# Patient Record
Sex: Male | Born: 1940
Health system: Southern US, Community
[De-identification: ages and names within clinical notes are randomized; demographics above are authoritative.]

## PROBLEM LIST (undated history)

## (undated) DIAGNOSIS — Z87442 Personal history of urinary calculi: Secondary | ICD-10-CM

## (undated) DIAGNOSIS — Z9849 Cataract extraction status, unspecified eye: Secondary | ICD-10-CM

## (undated) DIAGNOSIS — N200 Calculus of kidney: Secondary | ICD-10-CM

## (undated) DIAGNOSIS — K219 Gastro-esophageal reflux disease without esophagitis: Secondary | ICD-10-CM

## (undated) DIAGNOSIS — M1990 Unspecified osteoarthritis, unspecified site: Secondary | ICD-10-CM

## (undated) HISTORY — PX: APPENDECTOMY: SHX54

## (undated) HISTORY — PX: COLONOSCOPY: SHX174

## (undated) HISTORY — PX: OTHER SURGICAL HISTORY: SHX169

---

## 2000-09-10 ENCOUNTER — Emergency Department (HOSPITAL_COMMUNITY): Admission: EM | Admit: 2000-09-10 | Discharge: 2000-09-10 | Payer: Self-pay | Admitting: Emergency Medicine

## 2000-09-10 ENCOUNTER — Encounter: Payer: Self-pay | Admitting: *Deleted

## 2005-12-03 ENCOUNTER — Ambulatory Visit: Payer: Self-pay | Admitting: Internal Medicine

## 2005-12-03 ENCOUNTER — Ambulatory Visit (HOSPITAL_COMMUNITY): Admission: RE | Admit: 2005-12-03 | Discharge: 2005-12-03 | Payer: Self-pay | Admitting: Internal Medicine

## 2007-03-23 DIAGNOSIS — Z9849 Cataract extraction status, unspecified eye: Secondary | ICD-10-CM

## 2007-03-23 HISTORY — DX: Cataract extraction status, unspecified eye: Z98.49

## 2009-11-08 ENCOUNTER — Inpatient Hospital Stay (HOSPITAL_COMMUNITY): Admission: EM | Admit: 2009-11-08 | Discharge: 2009-11-10 | Payer: Self-pay | Admitting: Emergency Medicine

## 2010-06-05 LAB — COMPREHENSIVE METABOLIC PANEL
ALT: 13 U/L (ref 0–53)
AST: 17 U/L (ref 0–37)
Albumin: 3.6 g/dL (ref 3.5–5.2)
Alkaline Phosphatase: 46 U/L (ref 39–117)
Alkaline Phosphatase: 64 U/L (ref 39–117)
BUN: 10 mg/dL (ref 6–23)
BUN: 16 mg/dL (ref 6–23)
CO2: 26 mEq/L (ref 19–32)
Calcium: 8.9 mg/dL (ref 8.4–10.5)
Chloride: 106 mEq/L (ref 96–112)
Chloride: 109 mEq/L (ref 96–112)
Creatinine, Ser: 1 mg/dL (ref 0.4–1.5)
GFR calc Af Amer: >60 mL/min (ref >60)
GFR calc non Af Amer: >60 mL/min (ref >60)
Glucose, Bld: 114 mg/dL — ABNORMAL HIGH (ref 70–99)
Glucose, Bld: 83 mg/dL (ref 70–99)
Potassium: 4.2 mEq/L (ref 3.5–5.1)
Potassium: 4.3 mEq/L (ref 3.5–5.1)
Sodium: 137 mEq/L (ref 135–145)
Total Bilirubin: 0.6 mg/dL (ref 0.3–1.2)
Total Bilirubin: 0.9 mg/dL (ref 0.3–1.2)
Total Protein: 6.9 g/dL (ref 6.0–8.3)

## 2010-06-05 LAB — CBC
HCT: 38.7 % — ABNORMAL LOW (ref 39.0–52.0)
HCT: 38.8 % — ABNORMAL LOW (ref 39.0–52.0)
HCT: 45.2 % (ref 39.0–52.0)
Hemoglobin: 15.2 g/dL (ref 13.0–17.0)
MCH: 31.7 pg (ref 26.0–34.0)
MCHC: 33.1 g/dL (ref 30.0–36.0)
MCHC: 33.6 g/dL (ref 30.0–36.0)
MCV: 94.4 fL (ref 78.0–100.0)
MCV: 95.6 fL (ref 78.0–100.0)
Platelets: 149 10*3/uL — ABNORMAL LOW (ref 150–400)
RBC: 4.06 MIL/uL — ABNORMAL LOW (ref 4.22–5.81)
RBC: 4.79 MIL/uL (ref 4.22–5.81)
RDW: 12.8 % (ref 11.5–15.5)
RDW: 13.1 % (ref 11.5–15.5)
WBC: 14.4 10*3/uL — ABNORMAL HIGH (ref 4.0–10.5)
WBC: 5.8 10*3/uL (ref 4.0–10.5)

## 2010-06-05 LAB — URINALYSIS, ROUTINE W REFLEX MICROSCOPIC
Bilirubin Urine: NEGATIVE
Glucose, UA: NEGATIVE mg/dL
Hgb urine dipstick: NEGATIVE
Ketones, ur: NEGATIVE mg/dL
Nitrite: NEGATIVE
Protein, ur: NEGATIVE mg/dL
Specific Gravity, Urine: 1.019 (ref 1.005–1.030)
Urobilinogen, UA: 0.2 mg/dL (ref 0.0–1.0)
pH: 5 (ref 5.0–8.0)

## 2010-06-05 LAB — BASIC METABOLIC PANEL
BUN: 7 mg/dL (ref 6–23)
Calcium: 8.1 mg/dL — ABNORMAL LOW (ref 8.4–10.5)
GFR calc non Af Amer: >60 mL/min (ref >60)
Glucose, Bld: 96 mg/dL (ref 70–99)

## 2010-06-05 LAB — DIFFERENTIAL
Basophils Absolute: 0 10*3/uL (ref 0.0–0.1)
Basophils Relative: 0 % (ref 0–1)
Eosinophils Absolute: 0.1 K/uL (ref 0.0–0.7)
Eosinophils Relative: 1 % (ref 0–5)
Lymphocytes Relative: 2 % — ABNORMAL LOW (ref 12–46)
Lymphs Abs: 0.3 K/uL — ABNORMAL LOW (ref 0.7–4.0)
Monocytes Absolute: 1.1 10*3/uL — ABNORMAL HIGH (ref 0.1–1.0)
Monocytes Relative: 8 % (ref 3–12)
Neutro Abs: 12.8 10*3/uL — ABNORMAL HIGH (ref 1.7–7.7)
Neutrophils Relative %: 89 % — ABNORMAL HIGH (ref 43–77)

## 2010-06-05 LAB — LACTATE DEHYDROGENASE: LDH: 118 U/L (ref 94–250)

## 2010-06-05 LAB — MAGNESIUM: Magnesium: 1.8 mg/dL (ref 1.5–2.5)

## 2010-06-05 LAB — LIPASE, BLOOD: Lipase: 29 U/L (ref 11–59)

## 2010-06-05 LAB — HAPTOGLOBIN: Haptoglobin: 219 mg/dL — ABNORMAL HIGH (ref 16–200)

## 2010-06-05 LAB — POCT CARDIAC MARKERS: Myoglobin, poc: 101 ng/mL (ref 12–200)

## 2010-06-10 ENCOUNTER — Emergency Department (HOSPITAL_COMMUNITY)
Admission: EM | Admit: 2010-06-10 | Discharge: 2010-06-10 | Disposition: A | Discharge status: Home / Self Care | Payer: Medicare Other | Attending: Emergency Medicine | Admitting: Emergency Medicine

## 2010-06-10 ENCOUNTER — Emergency Department (HOSPITAL_COMMUNITY): Payer: Medicare Other

## 2010-06-10 DIAGNOSIS — R109 Unspecified abdominal pain: Secondary | ICD-10-CM | POA: Insufficient documentation

## 2010-06-10 DIAGNOSIS — M79609 Pain in unspecified limb: Secondary | ICD-10-CM | POA: Insufficient documentation

## 2010-06-10 LAB — COMPREHENSIVE METABOLIC PANEL
AST: 19 U/L (ref 0–37)
Albumin: 3.5 g/dL (ref 3.5–5.2)
Calcium: 8.7 mg/dL (ref 8.4–10.5)
Chloride: 104 mEq/L (ref 96–112)
Creatinine, Ser: 1.1 mg/dL (ref 0.4–1.5)
GFR calc Af Amer: >60 mL/min (ref >60)
Total Bilirubin: 0.7 mg/dL (ref 0.3–1.2)
Total Protein: 6.6 g/dL (ref 6.0–8.3)

## 2010-06-10 LAB — CBC
MCH: 32.2 pg (ref 26.0–34.0)
Platelets: 139 10*3/uL — ABNORMAL LOW (ref 150–400)
RBC: 4.35 MIL/uL (ref 4.22–5.81)
RDW: 13.1 % (ref 11.5–15.5)

## 2010-06-10 LAB — URINALYSIS, ROUTINE W REFLEX MICROSCOPIC
Glucose, UA: NEGATIVE mg/dL
Hgb urine dipstick: NEGATIVE
Specific Gravity, Urine: 1.025 (ref 1.005–1.030)
pH: 6 (ref 5.0–8.0)

## 2010-06-10 LAB — DIFFERENTIAL
Basophils Relative: 1 % (ref 0–1)
Eosinophils Absolute: 0.1 10*3/uL (ref 0.0–0.7)
Eosinophils Relative: 1 % (ref 0–5)
Monocytes Relative: 12 % (ref 3–12)
Neutrophils Relative %: 66 % (ref 43–77)

## 2010-08-07 NOTE — Op Note (Signed)
NAME:  Corey Zamora, Corey Zamora               ACCOUNT NO.:  [id number]   MEDICAL RECORD NO.:  [id number]          PATIENT TYPE:  AMB   LOCATION:  DAY                           FACILITY:  [facility]   PHYSICIAN:  Lionel December, M.D.    DATE OF BIRTH:  [birth year]   DATE OF PROCEDURE:  12/03/2005  DATE OF DISCHARGE:                                 OPERATIVE REPORT   PROCEDURE:  Esophagogastroduodenoscopy with esophageal dilation followed by  colonoscopy.   INDICATIONS FOR PROCEDURE:  Corey Zamora is a 70 year old Caucasian male who  is here for surveillance colonoscopy but he also complains of solid food  dysphagia.  He had his esophagus last dilated in March 2002 and responded  for awhile.  He denies frequent heartburn, he is very concerned about the  symptoms because he chews tobacco and he is aware of the potential risks.  He had tubular adenoma removed from his colon in 1997.  His last exam in  March 2002 was normal.  Family history is positive for colon carcinoma in a  brother.  The procedure risks were reviewed with the patient and informed  consent was obtained.   MEDICATIONS FOR CONSCIOUS SEDATION:  Benzocaine spray for pharyngeal topical  anesthesia, Fentanyl 25 mcg IV, Versed 2 mg IV.   FINDINGS:  The procedure is performed in the endoscopy suite.  The patient's  vital signs and O2 saturations were monitored during the procedure and  remained stable.   ESOPHAGOGASTRODUODENOSCOPY:  The patient was placed in the left lateral  position and the Olympus videoscope was passed via oropharynx without any  difficulty into the esophagus.   The esophageal mucosa of the esophagus was normal.  The GE junction was at  41 cm from the incisors.  There was no ring or stricture noted.   The stomach was empty and distended very well with insufflation.  The folds  of the proximal stomach were normal.  Examination of the mucosa at body,  antrum, pyloric channel, as well as angularis, fundus, and cardia was  normal.   Duodenum and bulbar mucosa was normal.  The scope was passed to the second  part of the duodenum where the mucosa and folds were normal.  The endoscope  was withdrawn.   The esophagus was dilated by passing 56 [place] Maloney dilator to full  insertion.  As the dilator was withdrawn, the endoscope was passed again and  no mucosal disruption noted in the esophagus.  The endoscope was withdrawn  and the patient prepared for procedure number 2.   COLONOSCOPY:  Rectal examination performed.  No abnormality noted on  external or digital exam.  The Olympus videoscope was placed in the rectum  and advanced under vision in the sigmoid colon and beyond.  The preparation  was satisfactory.  There were a few tiny diverticula noted at the sigmoid  colon.  The patient was placed in the supine position and turned on the  right side in order to get the scope to the cecum which was identified by  the appendiceal orifice and ileocecal valve.  A short segment of  TI was also  examined and was normal.  Pictures were taken for the record.  As the scope  was withdrawn, colonic mucosa was once again carefully examined and was  normal throughout.  The rectal mucosa was normal.  The scope was retroflexed  to examine the anorectal junction which was unremarkable.  The endoscope was  straightened and withdrawn.  The patient tolerated the procedure well.   FINAL DIAGNOSIS:  1. Normal esophagogastroduodenoscopy.  Esophagus dilated by passing 56      Gholson Maloney dilator given history of solid food dysphagia.  2. A few small diverticula at sigmoid colon, otherwise, normal      examination.   RECOMMENDATIONS:  1. He will resume his usual meds.  2. High fiber diet.  3. He will give [place] a call if he remains with dysphagia in which case I      will obtain a barium pill study.  4. Yearly hemoccults.  5. He should have next colonoscopy five years from now.      Lionel December, M.D.  Electronically  Signed     NR/MEDQ  D:  12/03/2005  T:  12/03/2005  Job:  [id number]   cc:   Kingsley Callander. Ouida Sills, MD  Fax: [phone]

## 2010-11-24 ENCOUNTER — Encounter (INDEPENDENT_AMBULATORY_CARE_PROVIDER_SITE_OTHER): Payer: Self-pay | Admitting: *Deleted

## 2010-11-26 ENCOUNTER — Telehealth (INDEPENDENT_AMBULATORY_CARE_PROVIDER_SITE_OTHER): Payer: Self-pay | Admitting: *Deleted

## 2010-11-26 DIAGNOSIS — Z800 Family history of malignant neoplasm of digestive organs: Secondary | ICD-10-CM

## 2010-11-26 DIAGNOSIS — R1310 Dysphagia, unspecified: Secondary | ICD-10-CM

## 2010-11-26 NOTE — Telephone Encounter (Signed)
TCS/EGD sch'd 02/18/11 @ 10:30 (9:30), movi prep instructions given

## 2010-11-30 MED ORDER — PEG-KCL-NACL-NASULF-NA ASC-C 100 G PO SOLR: 1.0000 | Freq: Once | ORAL | Status: DC

## 2010-12-07 ENCOUNTER — Encounter (INDEPENDENT_AMBULATORY_CARE_PROVIDER_SITE_OTHER): Payer: Self-pay | Admitting: *Deleted

## 2011-01-25 ENCOUNTER — Other Ambulatory Visit (INDEPENDENT_AMBULATORY_CARE_PROVIDER_SITE_OTHER): Payer: Self-pay | Admitting: *Deleted

## 2011-01-25 DIAGNOSIS — Z800 Family history of malignant neoplasm of digestive organs: Secondary | ICD-10-CM

## 2011-02-05 ENCOUNTER — Encounter (HOSPITAL_COMMUNITY): Payer: Self-pay | Admitting: Pharmacy Technician

## 2011-02-09 ENCOUNTER — Telehealth (INDEPENDENT_AMBULATORY_CARE_PROVIDER_SITE_OTHER): Payer: Self-pay | Admitting: *Deleted

## 2011-02-09 NOTE — Telephone Encounter (Signed)
PCP/Requesting MD:  Ouida Sills  Name: Corey Zamora  DOB: Dec 18, 2040  Home Phone: [phone]      Procedure: TCS/EGD  Reason/Indication:  SURVEILLANCE, + FH CRC, DYSPHAGIA  Has patient had this procedure before?  YES (TCS)  If so, when, by whom and where?  2007  Is there a family history of colon cancer?  YES  Who?  What age when diagnosed?  BROTHER  Is patient diabetic?   NO      Does patient have prosthetic heart valve?  NO  Do you have a pacemaker?  NO  Has patient had joint replacement within last 12 months?  NO  Is patient on Coumadin, Plavix and/or Aspirin? NO  Medications: FLAX SEED OIL 1000 MG TID  Allergies: TYLENOL 3, DARVOCET, ANCEF, ERYTHROMYCIN, VISTARIL, DENUROL, DOXYCYCLINE, ASA, PCN, ETHROMICIN, CIPRO, HYCLATE, IVP DYE, PHENERGAN, BUPRINIX, PHENERGAN W/ MORPHINE  Pharmacy: [facility]  Medication Adjustment: NONE  Procedure date & time: 02/18/11 @ 10:30

## 2011-02-10 NOTE — Telephone Encounter (Signed)
Agree with colonoscopy and EGD/ED

## 2011-02-17 MED ORDER — SODIUM CHLORIDE 0.45 % IV SOLN
Freq: Once | INTRAVENOUS | Status: AC
  Administered 2011-02-18: 10:00:00 via INTRAVENOUS

## 2011-02-18 ENCOUNTER — Other Ambulatory Visit (INDEPENDENT_AMBULATORY_CARE_PROVIDER_SITE_OTHER): Payer: Self-pay | Admitting: Internal Medicine

## 2011-02-18 ENCOUNTER — Ambulatory Visit (HOSPITAL_COMMUNITY)
Admission: RE | Admit: 2011-02-18 | Discharge: 2011-02-18 | Disposition: A | Discharge status: Home / Self Care | Payer: Medicare Other | Source: Ambulatory Visit | Attending: Internal Medicine | Admitting: Internal Medicine

## 2011-02-18 ENCOUNTER — Encounter (HOSPITAL_COMMUNITY): Payer: Self-pay | Admitting: *Deleted

## 2011-02-18 ENCOUNTER — Encounter (HOSPITAL_COMMUNITY)
Admission: RE | Disposition: A | Discharge status: Home / Self Care | Payer: Self-pay | Source: Ambulatory Visit | Attending: Internal Medicine

## 2011-02-18 DIAGNOSIS — Z860100 Personal history of colon polyps, unspecified: Secondary | ICD-10-CM | POA: Insufficient documentation

## 2011-02-18 DIAGNOSIS — R1310 Dysphagia, unspecified: Secondary | ICD-10-CM | POA: Insufficient documentation

## 2011-02-18 DIAGNOSIS — K2940 Chronic atrophic gastritis without bleeding: Secondary | ICD-10-CM | POA: Insufficient documentation

## 2011-02-18 DIAGNOSIS — R12 Heartburn: Secondary | ICD-10-CM

## 2011-02-18 DIAGNOSIS — D126 Benign neoplasm of colon, unspecified: Secondary | ICD-10-CM

## 2011-02-18 DIAGNOSIS — K2960 Other gastritis without bleeding: Secondary | ICD-10-CM

## 2011-02-18 DIAGNOSIS — Z800 Family history of malignant neoplasm of digestive organs: Secondary | ICD-10-CM

## 2011-02-18 DIAGNOSIS — K5730 Diverticulosis of large intestine without perforation or abscess without bleeding: Secondary | ICD-10-CM | POA: Insufficient documentation

## 2011-02-18 DIAGNOSIS — K208 Other esophagitis: Secondary | ICD-10-CM

## 2011-02-18 DIAGNOSIS — K228 Other specified diseases of esophagus: Secondary | ICD-10-CM

## 2011-02-18 DIAGNOSIS — K644 Residual hemorrhoidal skin tags: Secondary | ICD-10-CM | POA: Insufficient documentation

## 2011-02-18 SURGERY — COLONOSCOPY WITH ESOPHAGOGASTRODUODENOSCOPY (EGD)
Anesthesia: Moderate Sedation

## 2011-02-18 MED ORDER — MEPERIDINE HCL 50 MG/ML IJ SOLN
INTRAMUSCULAR | Status: DC | PRN
  Administered 2011-02-18 (×2): 25 mg via INTRAVENOUS

## 2011-02-18 MED ORDER — STERILE WATER FOR IRRIGATION IR SOLN
Status: DC | PRN
  Administered 2011-02-18: 10:00:00

## 2011-02-18 MED ORDER — PANTOPRAZOLE SODIUM 40 MG PO TBEC: 40.0000 mg | DELAYED_RELEASE_TABLET | Freq: Every day | ORAL | Status: DC

## 2011-02-18 MED ORDER — MEPERIDINE HCL 50 MG/ML IJ SOLN
INTRAMUSCULAR | Status: AC
  Filled 2011-02-18: qty 1

## 2011-02-18 MED ORDER — MIDAZOLAM HCL 5 MG/5ML IJ SOLN
INTRAMUSCULAR | Status: AC
  Filled 2011-02-18: qty 10

## 2011-02-18 MED ORDER — BUTAMBEN-TETRACAINE-BENZOCAINE 2-2-14 % EX AERO
INHALATION_SPRAY | CUTANEOUS | Status: DC | PRN
  Administered 2011-02-18: 2 via TOPICAL

## 2011-02-18 MED ORDER — MIDAZOLAM HCL 5 MG/5ML IJ SOLN
INTRAMUSCULAR | Status: DC | PRN
  Administered 2011-02-18 (×2): 2 mg via INTRAVENOUS
  Administered 2011-02-18: 1 mg via INTRAVENOUS

## 2011-02-18 NOTE — Op Note (Signed)
EGD AND COLONOSCOPY  PROCEDURE REPORT  PATIENT:  Corey Zamora  MR#:  [id number] Birthdate:  [birth year], 70 y.o., male Endoscopist:  Dr. [name], MD Referred By:  Dr. Carylon Perches, MD Procedure Date: 02/18/2011  Procedure:   EGD, ED & Colonoscopy  Indications:  Patient is 71 year old Caucasian male who presents with intermittent solid food dysphagia and frequent heartburn. Ulcers history of colonic adenoma and family history of colon carcinoma.            Informed Consent: Procedures and risks were reviewed with the patient and informed consent was obtained. Medications:  Demerol 50 mg IV Versed 5 mg IV Cetacaine spray topically for oropharyngeal anesthesia  EGD  Description of procedure:  The endoscope was introduced through the mouth and advanced to the second portion of the duodenum without difficulty or limitations. The mucosal surfaces were surveyed very carefully during advancement of the scope and upon withdrawal.  Findings:  Esophagus:  Mucosa of the proximal and middle third of the esophagus was normal. Over a centimeter long linear erosion at distal esophagus extending to GE junction. No ring or stricture was noted. Abnormal are salmon-colored patch of mucosa at GE junction. GEJ:  40 cm Stomach:  Stomach was empty and distended very well with insufflation. Folds in the proximal stomach were normal. Examination of mucosa revealed multiple antral/prepyloric erosions. Annularis fundus and cardia were examined by retroflexing the scope and were normal. Duodenum:  Normal bulbar and post bulbar mucosa.  Therapeutic/Diagnostic Maneuvers Performed:  Esophagus dilated by passing 56 [place] Maloney dilator to full insertion but no mucosal disruption noted. Small patch of salmon-colored mucosa was biopsied for histology to rule out short segment Barrett's.  COLONOSCOPY Description of procedure:  After a digital rectal exam was performed, that colonoscope was advanced from the anus  through the rectum and colon to the area of the cecum, ileocecal valve and appendiceal orifice. The cecum was deeply intubated. These structures were well-seen and photographed for the record. From the level of the cecum and ileocecal valve, the scope was slowly and cautiously withdrawn. The mucosal surfaces were carefully surveyed utilizing scope tip to flexion to facilitate fold flattening as needed. The scope was pulled down into the rectum where a thorough exam including retroflexion was performed.  Findings:   Prep excellent. Two  diverticula at hepatic flexure. Small polyp ablated via cold biopsy from ascending colon. Hemorrhoids below the dentate line.  Therapeutic/Diagnostic Maneuvers Performed:  See above  Complications:  None  Cecal Withdrawal Time:  17 minutes  Impression:  Erosive reflux esophagitis without obvious ring or stricture formation. Esophagus dilated by passing 56 Mcjunkins Maloney dilator; no mucosal disruption noted. Small patch of salmon-colored mucosa at GE junction biopsied for histology. Erosive antral gastritis Small polyp ablated via cold biopsy from ascending colon. Two diverticula at hepatic flexure. Small external hemorrhoids.   Recommendations:  Anti-reflux measures. Pantoprazole 40 mg by mouth every morning 30 minutes before breakfast. H. pylori serology. I will be contacting patient with results of biopsy and H. pylori serology.  REHMAN,NAJEEB U  02/18/2011 11:29 AM  CC: Dr. Carylon Perches, MD & Dr. Bonnetta Barry ref. provider found

## 2011-02-18 NOTE — H&P (Signed)
Corey Zamora is an 70 y.o. male.   Chief Complaint: Patient is a for EGD with ED and colonoscopy. HPI: Patient is 70 year old Caucasian male who presents with intermittent solid food dysphagia. He also complains of intermittent heartburn. He had his esophagus dilated about 5 years of the results. He denies anorexia, weight loss, nausea, vomiting or rectal bleeding. He has history of colonic adenomas. He had one removed in 1997. Family history significant for colon carcinoma in his brother who had condition in his 69s and is doing fine at 75.  History reviewed. No pertinent past medical history.  Past Surgical History  Procedure Date  . Right hand surgery   . Appendectomy     Family History  Problem Relation Age of Onset  . Colon cancer Brother    Social History:  reports that he has never smoked. His smokeless tobacco use includes Chew. He reports that he does not drink alcohol or use illicit drugs.  Allergies:  Allergies  Allergen Reactions  . Aspirin     hives  . Codeine     Vomiting and nausea  . Penicillins     hives    Medications Prior to Admission  Medication Dose Route Frequency Provider Last Rate Last Dose  . 0.45 % sodium chloride infusion   Intravenous Once [name], MD 20 mL/hr at 02/18/11 0932    . meperidine (DEMEROL) 50 MG/ML injection           . midazolam (VERSED) 5 MG/5ML injection           . simethicone susp in sterile water 1000 mL irrigation    PRN [name], MD       Medications Prior to Admission  Medication Sig Dispense Refill  . Flaxseed, Linseed, 1000 MG CAPS Take 1 capsule by mouth 3 (three) times daily.        . peg 3350 powder (MOVIPREP) 100 G SOLR Take 1 kit (100 g total) by mouth once.  1 kit  0  . vitamin C (ASCORBIC ACID) 500 MG tablet Take 1,000 mg by mouth 2 (two) times daily.          No results found for this or any previous visit (from the past 48 hour(s)). No results found.  Review of Systems  Constitutional:  Negative for weight loss.  Gastrointestinal: Negative for nausea, vomiting, abdominal pain, diarrhea, constipation, blood in stool and melena.    Blood pressure 134/83, pulse 64, temperature 98.3 F (36.8 C), temperature source Oral, resp. rate 21, height 5\' 6"  (1.676 m), weight 165 lb (74.844 kg), SpO2 99.00%. Physical Exam  Constitutional: He appears well-developed and well-nourished.  HENT:  Mouth/Throat: Oropharynx is clear and moist.       Patient is edentulous; he has dentures that he does not use them  Eyes: Conjunctivae are normal. No scleral icterus.  Neck: No thyromegaly present.  Cardiovascular: Normal rate, regular rhythm and normal heart sounds.   No murmur heard. Respiratory: Effort normal and breath sounds normal.  GI: Soft. He exhibits no distension and no mass. There is no tenderness.  Musculoskeletal: He exhibits no edema.  Lymphadenopathy:    He has no cervical adenopathy.  Neurological: He is alert.  Skin: Skin is warm and dry.     Assessment/Plan Dysphagia. History of colonic adenoma and family history of colon carcinoma. EGD possible ED and colonoscopy  Corey Zamora U 02/18/2011, 10:27 AM

## 2011-02-19 LAB — H. PYLORI ANTIBODY, IGG: H Pylori IgG: 0.48 {ISR}

## 2011-03-01 ENCOUNTER — Encounter (INDEPENDENT_AMBULATORY_CARE_PROVIDER_SITE_OTHER): Payer: Self-pay | Admitting: *Deleted

## 2011-06-17 ENCOUNTER — Ambulatory Visit (INDEPENDENT_AMBULATORY_CARE_PROVIDER_SITE_OTHER): Payer: Medicare Other | Admitting: Otolaryngology

## 2011-06-17 DIAGNOSIS — H6520 Chronic serous otitis media, unspecified ear: Secondary | ICD-10-CM

## 2011-06-17 DIAGNOSIS — H908 Mixed conductive and sensorineural hearing loss, unspecified: Secondary | ICD-10-CM

## 2011-06-17 DIAGNOSIS — H6980 Other specified disorders of Eustachian tube, unspecified ear: Secondary | ICD-10-CM | POA: Diagnosis not present

## 2011-06-17 DIAGNOSIS — J342 Deviated nasal septum: Secondary | ICD-10-CM | POA: Diagnosis not present

## 2011-06-17 DIAGNOSIS — J310 Chronic rhinitis: Secondary | ICD-10-CM

## 2011-06-23 DIAGNOSIS — H6980 Other specified disorders of Eustachian tube, unspecified ear: Secondary | ICD-10-CM | POA: Diagnosis not present

## 2011-06-23 DIAGNOSIS — H902 Conductive hearing loss, unspecified: Secondary | ICD-10-CM | POA: Diagnosis not present

## 2011-06-23 DIAGNOSIS — H6520 Chronic serous otitis media, unspecified ear: Secondary | ICD-10-CM | POA: Diagnosis not present

## 2011-06-28 DIAGNOSIS — M5030 Other cervical disc degeneration, unspecified cervical region: Secondary | ICD-10-CM | POA: Diagnosis not present

## 2011-06-28 DIAGNOSIS — M542 Cervicalgia: Secondary | ICD-10-CM | POA: Diagnosis not present

## 2011-07-22 ENCOUNTER — Ambulatory Visit (INDEPENDENT_AMBULATORY_CARE_PROVIDER_SITE_OTHER): Payer: Medicare Other | Admitting: Otolaryngology

## 2011-07-22 DIAGNOSIS — H6980 Other specified disorders of Eustachian tube, unspecified ear: Secondary | ICD-10-CM | POA: Diagnosis not present

## 2011-07-22 DIAGNOSIS — H7200 Central perforation of tympanic membrane, unspecified ear: Secondary | ICD-10-CM | POA: Diagnosis not present

## 2012-01-27 ENCOUNTER — Ambulatory Visit (INDEPENDENT_AMBULATORY_CARE_PROVIDER_SITE_OTHER): Payer: Medicare Other | Admitting: Otolaryngology

## 2012-02-23 ENCOUNTER — Encounter (INDEPENDENT_AMBULATORY_CARE_PROVIDER_SITE_OTHER): Payer: Self-pay | Admitting: *Deleted

## 2012-03-08 ENCOUNTER — Ambulatory Visit (INDEPENDENT_AMBULATORY_CARE_PROVIDER_SITE_OTHER): Payer: Medicare Other | Admitting: Internal Medicine

## 2012-03-08 ENCOUNTER — Encounter (INDEPENDENT_AMBULATORY_CARE_PROVIDER_SITE_OTHER): Payer: Self-pay | Admitting: Internal Medicine

## 2012-03-08 VITALS — BP 98/56 | HR 60 | Temp 98.4°F | Ht 66.0 in | Wt 166.9 lb

## 2012-03-08 DIAGNOSIS — R1310 Dysphagia, unspecified: Secondary | ICD-10-CM | POA: Diagnosis not present

## 2012-03-08 DIAGNOSIS — Z86010 Personal history of colonic polyps: Secondary | ICD-10-CM | POA: Diagnosis not present

## 2012-03-08 DIAGNOSIS — D126 Benign neoplasm of colon, unspecified: Secondary | ICD-10-CM | POA: Diagnosis not present

## 2012-03-08 NOTE — Progress Notes (Signed)
Subjective:     Patient ID: Corey Zamora, male   DOB: 06/20/1940, 71 y.o.   MRN: [id number]  HPIStates he is doing good. He tells me his swallowing is good. He can eat anything he wants except for a steak. He can eat a chopped steak. Appetite is good. No weight loss. BMs are norma. Has a BM once a day. No melena or bright red rectal bleeding. ++ Hx of dysphagia. Hx of colonic adenomas. Family hx of colon cancer in a brother at age 16 and is now 49.   01/2011 EGD/Colonoscopy: Cecal Withdrawal Time: 17 minutes  Impression:  Erosive reflux esophagitis without obvious ring or stricture formation. Esophagus dilated by passing 56 Radovich Maloney dilator; no mucosal disruption noted.  Small patch of salmon-colored mucosa at GE junction biopsied for histology.  Erosive antral gastritis  Small polyp ablated via cold biopsy from ascending colon.  Two diverticula at hepatic flexure.  Small external hemorrhoids.   11/2005 EGD/Colonoscopy: 1. Normal esophagogastroduodenoscopy. Esophagus dilated by passing 56  Griffey Maloney dilator given history of solid food dysphagia.  2. A few small diverticula at sigmoid colon, otherwise, normal  examination.  Review of Systems see hpi Current Outpatient Prescriptions  Medication Sig Dispense Refill  . vitamin C (ASCORBIC ACID) 500 MG tablet Take 1,000 mg by mouth as needed.       . pantoprazole (PROTONIX) 40 MG tablet Take 1 tablet (40 mg total) by mouth daily.  30 tablet  11  History reviewed. No pertinent past medical history. Past Surgical History  Procedure Date  . Right hand surgery   . Appendectomy    Allergies  Allergen Reactions  . Aspirin     hives  . Codeine     Vomiting and nausea  . Penicillins     hives        Objective:   Physical Exam Filed Vitals:   03/08/12 1524  BP: 98/56  Pulse: 60  Temp: 98.4 F (36.9 C)  Height: 5\' 6"  (1.676 m)  Weight: 166 lb 14.4 oz (75.705 kg)  Alert and oriented. Skin warm and dry. Oral mucosa  is moist.   . Sclera anicteric, conjunctivae is pink. Thyroid not enlarged. No cervical lymphadenopathy. Lungs clear. Heart regular rate and rhythm.  Abdomen is soft. Bowel sounds are positive. No hepatomegaly. No abdominal masses felt. No tenderness.  No edema to lower extremities.  Stool negative for blood.     Assessment:    Dysphagia: No problems at this time.    Hx of colonic adenoma: Guaiac negative today. Next colonoscopy in 2017. Plan:     Follow up in one year.

## 2012-03-24 DIAGNOSIS — J111 Influenza due to unidentified influenza virus with other respiratory manifestations: Secondary | ICD-10-CM | POA: Diagnosis not present

## 2012-04-03 DIAGNOSIS — Z125 Encounter for screening for malignant neoplasm of prostate: Secondary | ICD-10-CM | POA: Diagnosis not present

## 2012-04-03 DIAGNOSIS — E785 Hyperlipidemia, unspecified: Secondary | ICD-10-CM | POA: Diagnosis not present

## 2012-04-03 DIAGNOSIS — Z79899 Other long term (current) drug therapy: Secondary | ICD-10-CM | POA: Diagnosis not present

## 2012-04-10 DIAGNOSIS — I4510 Unspecified right bundle-branch block: Secondary | ICD-10-CM | POA: Diagnosis not present

## 2012-04-10 DIAGNOSIS — Z1212 Encounter for screening for malignant neoplasm of rectum: Secondary | ICD-10-CM | POA: Diagnosis not present

## 2012-04-10 DIAGNOSIS — R6889 Other general symptoms and signs: Secondary | ICD-10-CM | POA: Diagnosis not present

## 2012-04-10 DIAGNOSIS — K2270 Barrett's esophagus without dysplasia: Secondary | ICD-10-CM | POA: Diagnosis not present

## 2012-04-10 DIAGNOSIS — N200 Calculus of kidney: Secondary | ICD-10-CM | POA: Diagnosis not present

## 2012-04-18 DIAGNOSIS — H26499 Other secondary cataract, unspecified eye: Secondary | ICD-10-CM | POA: Diagnosis not present

## 2012-04-29 DIAGNOSIS — Z23 Encounter for immunization: Secondary | ICD-10-CM | POA: Diagnosis not present

## 2012-07-24 ENCOUNTER — Encounter (HOSPITAL_COMMUNITY): Payer: Self-pay | Admitting: *Deleted

## 2012-07-24 ENCOUNTER — Emergency Department (HOSPITAL_COMMUNITY): Payer: Medicare Other

## 2012-07-24 ENCOUNTER — Emergency Department (HOSPITAL_COMMUNITY)
Admission: EM | Admit: 2012-07-24 | Discharge: 2012-07-24 | Disposition: A | Discharge status: Home / Self Care | Payer: Medicare Other | Attending: Emergency Medicine | Admitting: Emergency Medicine

## 2012-07-24 DIAGNOSIS — N201 Calculus of ureter: Secondary | ICD-10-CM

## 2012-07-24 DIAGNOSIS — Z79899 Other long term (current) drug therapy: Secondary | ICD-10-CM | POA: Insufficient documentation

## 2012-07-24 DIAGNOSIS — N200 Calculus of kidney: Secondary | ICD-10-CM | POA: Diagnosis not present

## 2012-07-24 DIAGNOSIS — N23 Unspecified renal colic: Secondary | ICD-10-CM | POA: Diagnosis not present

## 2012-07-24 DIAGNOSIS — N1330 Unspecified hydronephrosis: Secondary | ICD-10-CM | POA: Diagnosis not present

## 2012-07-24 DIAGNOSIS — R112 Nausea with vomiting, unspecified: Secondary | ICD-10-CM | POA: Insufficient documentation

## 2012-07-24 HISTORY — DX: Calculus of kidney: N20.0

## 2012-07-24 LAB — CBC WITH DIFFERENTIAL/PLATELET
Basophils Absolute: 0 10*3/uL (ref 0.0–0.1)
Basophils Relative: 0 % (ref 0–1)
Eosinophils Absolute: 0 10*3/uL (ref 0.0–0.7)
Eosinophils Relative: 0 % (ref 0–5)
HCT: 41.7 % (ref 39.0–52.0)
Hemoglobin: 14.6 g/dL (ref 13.0–17.0)
MCH: 32.5 pg (ref 26.0–34.0)
MCHC: 35 g/dL (ref 30.0–36.0)
Monocytes Absolute: 0.7 10*3/uL (ref 0.1–1.0)
Monocytes Relative: 7 % (ref 3–12)
RDW: 12.6 % (ref 11.5–15.5)

## 2012-07-24 LAB — URINALYSIS, ROUTINE W REFLEX MICROSCOPIC
Bilirubin Urine: NEGATIVE
Nitrite: NEGATIVE
Protein, ur: NEGATIVE mg/dL
Specific Gravity, Urine: 1.025 (ref 1.005–1.030)
Urobilinogen, UA: 0.2 mg/dL (ref 0.0–1.0)

## 2012-07-24 LAB — BASIC METABOLIC PANEL
BUN: 15 mg/dL (ref 6–23)
Calcium: 9.1 mg/dL (ref 8.4–10.5)
Creatinine, Ser: 1.13 mg/dL (ref 0.50–1.35)
GFR calc Af Amer: 73 mL/min — ABNORMAL LOW (ref >90)
GFR calc non Af Amer: 63 mL/min — ABNORMAL LOW (ref >90)

## 2012-07-24 LAB — URINE MICROSCOPIC-ADD ON

## 2012-07-24 MED ORDER — FENTANYL CITRATE 0.05 MG/ML IJ SOLN
INTRAMUSCULAR | Status: AC
  Administered 2012-07-24: 50 ug via INTRAVENOUS
  Filled 2012-07-24: qty 2

## 2012-07-24 MED ORDER — TAMSULOSIN HCL 0.4 MG PO CAPS
0.4000 mg | ORAL_CAPSULE | Freq: Once | ORAL | Status: AC
  Administered 2012-07-24: 0.4 mg via ORAL
  Filled 2012-07-24: qty 1

## 2012-07-24 MED ORDER — ONDANSETRON HCL 4 MG/2ML IJ SOLN
INTRAMUSCULAR | Status: AC
  Administered 2012-07-24: 4 mg via INTRAVENOUS
  Filled 2012-07-24: qty 2

## 2012-07-24 MED ORDER — TAMSULOSIN HCL 0.4 MG PO CAPS: ORAL_CAPSULE | ORAL | Status: DC

## 2012-07-24 MED ORDER — ONDANSETRON HCL 4 MG/2ML IJ SOLN
4.0000 mg | Freq: Once | INTRAMUSCULAR | Status: AC
  Administered 2012-07-24: 4 mg via INTRAVENOUS

## 2012-07-24 MED ORDER — OXYCODONE-ACETAMINOPHEN 5-325 MG PO TABS: 1.0000 | ORAL_TABLET | ORAL | Status: DC | PRN

## 2012-07-24 MED ORDER — FENTANYL CITRATE 0.05 MG/ML IJ SOLN
50.0000 ug | Freq: Once | INTRAMUSCULAR | Status: AC
  Administered 2012-07-24: 50 ug via INTRAVENOUS

## 2012-07-24 MED ORDER — OXYCODONE-ACETAMINOPHEN 5-325 MG PO TABS: 2.0000 | ORAL_TABLET | ORAL | Status: DC | PRN

## 2012-07-24 MED ORDER — TAMSULOSIN HCL 0.4 MG PO CAPS
ORAL_CAPSULE | ORAL | Status: AC
  Filled 2012-07-24: qty 1

## 2012-07-24 NOTE — ED Notes (Signed)
Lt flank pain , onset 10 am, vomiting.

## 2012-07-24 NOTE — ED Notes (Signed)
Pt denies pain or nausea at present time.  Requesting something to eat and drink.  Crackers and coke provided. Denies additional needs.  No distress noted at present.

## 2012-07-24 NOTE — ED Provider Notes (Signed)
History     This chart was scribed for Flint Melter, MD, MD by Smitty Pluck, ED Scribe. The patient was seen in room [facility] and the patient's care was started at 5:03 PM.   CSN: [id number]  Arrival date & time 07/24/12  1445      Chief Complaint  Patient presents with  . Flank Pain     The history is provided by the patient and medical records. No language interpreter was used.   Corey Zamora is a 72 y.o. male who presents to the Emergency Department complaining of intermittent, moderate left flank pain radiating to left groin onset 2 days ago. Pt states that the current symptoms feel similar to past episodes of kidney stones. He states he vomited 1x today. Pt denies fever, chills, hematuria, constipation, difficulty urinating,  diarrhea, weakness, cough, SOB and any other pain.   Past Medical History  Diagnosis Date  . Kidney stones     Past Surgical History  Procedure Laterality Date  . Right hand surgery    . Appendectomy      Family History  Problem Relation Age of Onset  . Colon cancer Brother     History  Substance Use Topics  . Smoking status: Never Smoker   . Smokeless tobacco: Current User    Types: Chew     Comment: chews tobacco  . Alcohol Use: No      Review of Systems  Gastrointestinal: Positive for nausea and vomiting.  Genitourinary: Positive for flank pain.  All other systems reviewed and are negative.    Allergies  Aspirin; Codeine; and Penicillins  Home Medications   Current Outpatient Rx  Name  Route  Sig  Dispense  Refill  . vitamin C (ASCORBIC ACID) 500 MG tablet   Oral   Take 1,000 mg by mouth daily.          Marland Kitchen oxyCODONE-acetaminophen (PERCOCET) 5-325 MG per tablet   Oral   Take 2 tablets by mouth every 4 (four) hours as needed for pain.   20 tablet   0   . oxyCODONE-acetaminophen (PERCOCET/ROXICET) 5-325 MG per tablet   Oral   Take 1 tablet by mouth every 4 (four) hours as needed for pain.   6 tablet   0    . tamsulosin (FLOMAX) 0.4 MG CAPS      1 q HS to aid stone passage   7 capsule   0     BP 153/71  Pulse 60  Temp(Src) 98.6 F (37 C) (Oral)  Resp 20  Ht 5\' 6"  (1.676 m)  Wt 160 lb (72.576 kg)  BMI 25.84 kg/m2  SpO2 99%  Physical Exam  Nursing note and vitals reviewed. Constitutional: He is oriented to person, place, and time. He appears well-developed and well-nourished.  HENT:  Head: Normocephalic and atraumatic.  Right Ear: External ear normal.  Left Ear: External ear normal.  Mouth/Throat: Mucous membranes are dry.  Eyes: Conjunctivae and EOM are normal. Pupils are equal, round, and reactive to light.  Neck: Normal range of motion and phonation normal. Neck supple.  Cardiovascular: Normal rate, regular rhythm, normal heart sounds and intact distal pulses.   Pulmonary/Chest: Effort normal and breath sounds normal. He exhibits no bony tenderness.  Abdominal: Soft. Normal appearance. There is tenderness (left mid abdomen). There is no rebound and no guarding.  Genitourinary: Testes normal and penis normal.  NO CVA tenderness     Musculoskeletal: Normal range of motion.  Neurological: He is  alert and oriented to person, place, and time. He has normal strength. No cranial nerve deficit or sensory deficit. He exhibits normal muscle tone. Coordination normal.  Skin: Skin is warm, dry and intact.  Red raised raised rash on medial thighs bilaterally.  Psychiatric: He has a normal mood and affect. His behavior is normal. Judgment and thought content normal.    ED Course  Procedures (including critical care time) DIAGNOSTIC STUDIES: Oxygen Saturation is 99% on room air, normal by my interpretation.   Filed Vitals:   07/24/12 1449 07/24/12 1712 07/24/12 1945  BP: 153/71 131/68 124/68  Pulse: 60 62 65  Temp: 98.6 F (37 C) 98.1 F (36.7 C)   TempSrc: Oral Oral   Resp: 20 16 18   Height: 5\' 6"  (1.676 m)    Weight: 160 lb (72.576 kg)    SpO2: 99% 96% 96%     COORDINATION OF CARE: 5:09 PM Discussed ED treatment with pt and pt agrees.  Medications  fentaNYL (SUBLIMAZE) injection 50 mcg (50 mcg Intravenous Given 07/24/12 1641)  ondansetron (ZOFRAN) injection 4 mg (4 mg Intravenous Given 07/24/12 1642)  tamsulosin (FLOMAX) capsule 0.4 mg (0.4 mg Oral Given 07/24/12 2103)   8:22 PM Discusses lab and imaging results with pt and pt's family. Pt reports that he has some mild pain still. Pt will be given pain medication. He is ready for discharge.     Labs Reviewed  URINALYSIS, ROUTINE W REFLEX MICROSCOPIC - Abnormal; Notable for the following:    Hgb urine dipstick SMALL (*)    All other components within normal limits  CBC WITH DIFFERENTIAL - Abnormal; Notable for the following:    Platelets 130 (*)    Neutrophils Relative 86 (*)    Neutro Abs 9.1 (*)    Lymphocytes Relative 6 (*)    All other components within normal limits  BASIC METABOLIC PANEL - Abnormal; Notable for the following:    Glucose, Bld 102 (*)    GFR calc non Af Amer 63 (*)    GFR calc Af Amer 73 (*)    All other components within normal limits  URINE MICROSCOPIC-ADD ON   Ct Abdomen Pelvis Wo Contrast  07/24/2012  *RADIOLOGY REPORT*  Clinical Data: Left flank pain  CT ABDOMEN AND PELVIS WITHOUT CONTRAST  Technique:  Multidetector CT imaging of the abdomen and pelvis was performed following the standard protocol without intravenous contrast.  Comparison: CT 06/10/2010  Findings: Lung bases are clear.  No pericardial fluid.  No focal hepatic lesion was noncontrast exam.  The pancreas, spleen, and adrenal glands are normal.  There is hydronephrosis and hydroureter on the left.  This is secondary to a large obstructing calculus in the distal left ureter. Calculus is approximately 3 cm from the vesicoureteral junction.  This calculus measures 6 mm axial dimension and 10 mm in craniocaudad dimension (image 68, series 2).  This calculus is evident on the CT scout.  There are multiple  additional vascular calcification within the pelvis.  There are approximately seven additional calculi within the left kidney ranging in size from 2-4 mm.  There are four calculi within the right kidney ranging size from 2-4 mm.  No right ureterolithiasis.  The stomach, small bowel, and colon are normal.  Abdominal aorta normal caliber.  No retroperitoneal periportal lymphadenopathy.  There are no bladder stones.  The prostate gland is normal.  Degenerative change of the spine.  The  IMPRESSION:  1.  Large obstructing calculus within the distal left  ureter with hydroureter and hydronephrosis on the left. 2.  Bilateral nephrolithiasis.   Original Report Authenticated By: Genevive Bi, M.D.      1. Ureteral stone   2. Ureteral colic       MDM  Large ureteral stone, fairly low in the ureter with the possibility of passage. There is no evidence for urinary tract infection, sepsis, or metabolic instability. He is stable for discharge with outpatient management  Nursing Notes Reviewed/ Care Coordinated, and agree without changes. Applicable Imaging Reviewed.  Interpretation of Laboratory Data incorporated into ED treatment   Plan: Home Medications- Norco; Home Treatments- rest; Recommended follow up- PCP prn    I personally performed the services described in this documentation, which was scribed in my presence. The recorded information has been reviewed and is accurate.       Flint Melter, MD 07/25/12 [phone]

## 2012-07-31 MED FILL — Oxycodone w/ Acetaminophen Tab 5-325 MG: ORAL | Qty: 6 | Status: AC

## 2012-08-03 ENCOUNTER — Other Ambulatory Visit: Payer: Self-pay | Admitting: Urology

## 2012-08-03 DIAGNOSIS — N200 Calculus of kidney: Secondary | ICD-10-CM | POA: Diagnosis not present

## 2012-08-03 DIAGNOSIS — N201 Calculus of ureter: Secondary | ICD-10-CM | POA: Diagnosis not present

## 2012-08-04 ENCOUNTER — Encounter (HOSPITAL_BASED_OUTPATIENT_CLINIC_OR_DEPARTMENT_OTHER): Payer: Self-pay | Admitting: *Deleted

## 2012-08-04 ENCOUNTER — Encounter (HOSPITAL_BASED_OUTPATIENT_CLINIC_OR_DEPARTMENT_OTHER): Payer: Self-pay | Admitting: Anesthesiology

## 2012-08-04 ENCOUNTER — Ambulatory Visit (HOSPITAL_BASED_OUTPATIENT_CLINIC_OR_DEPARTMENT_OTHER): Payer: Medicare Other | Admitting: Anesthesiology

## 2012-08-04 ENCOUNTER — Encounter (HOSPITAL_BASED_OUTPATIENT_CLINIC_OR_DEPARTMENT_OTHER)
Admission: RE | Disposition: A | Discharge status: Home / Self Care | Payer: Self-pay | Source: Ambulatory Visit | Attending: Urology

## 2012-08-04 ENCOUNTER — Ambulatory Visit (HOSPITAL_BASED_OUTPATIENT_CLINIC_OR_DEPARTMENT_OTHER)
Admission: RE | Admit: 2012-08-04 | Discharge: 2012-08-04 | Disposition: A | Discharge status: Home / Self Care | Payer: Medicare Other | Source: Ambulatory Visit | Attending: Urology | Admitting: Urology

## 2012-08-04 DIAGNOSIS — Z87442 Personal history of urinary calculi: Secondary | ICD-10-CM | POA: Insufficient documentation

## 2012-08-04 DIAGNOSIS — N400 Enlarged prostate without lower urinary tract symptoms: Secondary | ICD-10-CM | POA: Diagnosis not present

## 2012-08-04 DIAGNOSIS — N201 Calculus of ureter: Secondary | ICD-10-CM | POA: Insufficient documentation

## 2012-08-04 DIAGNOSIS — N200 Calculus of kidney: Secondary | ICD-10-CM | POA: Insufficient documentation

## 2012-08-04 DIAGNOSIS — Z79899 Other long term (current) drug therapy: Secondary | ICD-10-CM | POA: Insufficient documentation

## 2012-08-04 DIAGNOSIS — N1330 Unspecified hydronephrosis: Secondary | ICD-10-CM | POA: Diagnosis not present

## 2012-08-04 HISTORY — PX: HOLMIUM LASER APPLICATION: SHX5852

## 2012-08-04 HISTORY — PX: CYSTOSCOPY/RETROGRADE/URETEROSCOPY/STONE EXTRACTION WITH BASKET: SHX5317

## 2012-08-04 HISTORY — PX: CYSTOSCOPY WITH STENT PLACEMENT: SHX5790

## 2012-08-04 LAB — POCT HEMOGLOBIN-HEMACUE: Hemoglobin: 13.9 g/dL (ref 13.0–17.0)

## 2012-08-04 SURGERY — HOLMIUM LASER APPLICATION
Anesthesia: General | Site: Ureter | Laterality: Left | Wound class: Clean Contaminated

## 2012-08-04 MED ORDER — EPHEDRINE SULFATE 50 MG/ML IJ SOLN
INTRAMUSCULAR | Status: DC | PRN
  Administered 2012-08-04: 10 mg via INTRAVENOUS

## 2012-08-04 MED ORDER — DEXAMETHASONE SODIUM PHOSPHATE 4 MG/ML IJ SOLN
INTRAMUSCULAR | Status: DC | PRN
  Administered 2012-08-04: 4 mg via INTRAVENOUS

## 2012-08-04 MED ORDER — SODIUM CHLORIDE 0.9 % IR SOLN
Status: DC | PRN
  Administered 2012-08-04: 6000 mL

## 2012-08-04 MED ORDER — MEPERIDINE HCL 25 MG/ML IJ SOLN
6.2500 mg | INTRAMUSCULAR | Status: DC | PRN
  Filled 2012-08-04: qty 1

## 2012-08-04 MED ORDER — HYDROCODONE-ACETAMINOPHEN 5-325 MG PO TABS: 1.0000 | ORAL_TABLET | Freq: Four times a day (QID) | ORAL | Status: DC | PRN

## 2012-08-04 MED ORDER — FENTANYL CITRATE 0.05 MG/ML IJ SOLN
INTRAMUSCULAR | Status: DC | PRN
  Administered 2012-08-04: 25 ug via INTRAVENOUS
  Administered 2012-08-04: 50 ug via INTRAVENOUS
  Administered 2012-08-04: 25 ug via INTRAVENOUS

## 2012-08-04 MED ORDER — ONDANSETRON HCL 4 MG/2ML IJ SOLN
INTRAMUSCULAR | Status: DC | PRN
  Administered 2012-08-04: 4 mg via INTRAVENOUS

## 2012-08-04 MED ORDER — CIPROFLOXACIN IN D5W 400 MG/200ML IV SOLN
400.0000 mg | INTRAVENOUS | Status: AC
  Administered 2012-08-04: 400 mg via INTRAVENOUS
  Filled 2012-08-04: qty 200

## 2012-08-04 MED ORDER — HYDROMORPHONE HCL PF 1 MG/ML IJ SOLN
0.2500 mg | INTRAMUSCULAR | Status: DC | PRN
  Filled 2012-08-04: qty 1

## 2012-08-04 MED ORDER — ACETAMINOPHEN 10 MG/ML IV SOLN
1000.0000 mg | Freq: Once | INTRAVENOUS | Status: DC | PRN
  Filled 2012-08-04: qty 100

## 2012-08-04 MED ORDER — LACTATED RINGERS IV SOLN
INTRAVENOUS | Status: DC
  Administered 2012-08-04 (×2): via INTRAVENOUS
  Filled 2012-08-04: qty 1000

## 2012-08-04 MED ORDER — PROPOFOL 10 MG/ML IV BOLUS
INTRAVENOUS | Status: DC | PRN
  Administered 2012-08-04: 150 mg via INTRAVENOUS

## 2012-08-04 MED ORDER — IOHEXOL 350 MG/ML SOLN
INTRAVENOUS | Status: DC | PRN
  Administered 2012-08-04: 5 mL

## 2012-08-04 MED ORDER — MIDAZOLAM HCL 5 MG/5ML IJ SOLN
INTRAMUSCULAR | Status: DC | PRN
  Administered 2012-08-04: 2 mg via INTRAVENOUS

## 2012-08-04 MED ORDER — LIDOCAINE HCL (CARDIAC) 20 MG/ML IV SOLN
INTRAVENOUS | Status: DC | PRN
  Administered 2012-08-04: 60 mg via INTRAVENOUS

## 2012-08-04 MED ORDER — CIPROFLOXACIN IN D5W 400 MG/200ML IV SOLN
INTRAVENOUS | Status: DC | PRN
  Administered 2012-08-04: 400 mg via INTRAVENOUS

## 2012-08-04 MED ORDER — PROMETHAZINE HCL 25 MG/ML IJ SOLN
6.2500 mg | INTRAMUSCULAR | Status: DC | PRN
  Filled 2012-08-04: qty 1

## 2012-08-04 SURGICAL SUPPLY — 31 items
ADAPTER CATH URET PLST 4-6FR (CATHETERS) IMPLANT
BAG DRAIN URO-CYSTO SKYTR STRL (DRAIN) ×3 IMPLANT
BASKET LASER NITINOL 1.9FR (BASKET) IMPLANT
BASKET STNLS GEMINI 4WIRE 3FR (BASKET) IMPLANT
BASKET ZERO TIP NITINOL 2.4FR (BASKET) ×3 IMPLANT
BRUSH URET BIOPSY 3F (UROLOGICAL SUPPLIES) IMPLANT
CANISTER SUCT LVC 12 LTR MEDI- (MISCELLANEOUS) IMPLANT
CATH INTERMIT  6FR 70CM (CATHETERS) IMPLANT
CATH URET 5FR 28IN CONE TIP (BALLOONS) ×1
CATH URET 5FR 28IN OPEN ENDED (CATHETERS) IMPLANT
CATH URET 5FR 70CM CONE TIP (BALLOONS) ×2 IMPLANT
CLOTH BEACON ORANGE TIMEOUT ST (SAFETY) ×3 IMPLANT
DRAPE CAMERA CLOSED 9X96 (DRAPES) ×3 IMPLANT
GLOVE BIO SURGEON STRL SZ7 (GLOVE) ×3 IMPLANT
GLOVE ECLIPSE 6.5 STRL STRAW (GLOVE) ×3 IMPLANT
GLOVE INDICATOR 6.5 STRL GRN (GLOVE) ×3 IMPLANT
GLOVE INDICATOR 7.5 STRL GRN (GLOVE) ×3 IMPLANT
GUIDEWIRE 0.038 PTFE COATED (WIRE) IMPLANT
GUIDEWIRE ANG ZIPWIRE 038X150 (WIRE) IMPLANT
GUIDEWIRE STR DUAL SENSOR (WIRE) ×3 IMPLANT
KIT BALLIN UROMAX 15FX10 (LABEL) IMPLANT
KIT BALLN UROMAX 15FX4 (MISCELLANEOUS) IMPLANT
KIT BALLN UROMAX 26 75X4 (MISCELLANEOUS)
LASER FIBER DISP (UROLOGICAL SUPPLIES) ×3 IMPLANT
LASER FIBER DISP 1000U (UROLOGICAL SUPPLIES) IMPLANT
NS IRRIG 500ML POUR BTL (IV SOLUTION) IMPLANT
PACK CYSTOSCOPY (CUSTOM PROCEDURE TRAY) ×3 IMPLANT
SET HIGH PRES BAL DIL (LABEL)
SHEATH URET ACCESS 12FR/35CM (UROLOGICAL SUPPLIES) ×3 IMPLANT
SHEATH URET ACCESS 12FR/55CM (UROLOGICAL SUPPLIES) IMPLANT
STENT CONTOUR 6FRX24X.038 (STENTS) ×3 IMPLANT

## 2012-08-04 NOTE — Transfer of Care (Signed)
Immediate Anesthesia Transfer of Care Note  Patient: Corey Zamora  Procedure(s) Performed: Procedure(s): HOLMIUM LASER APPLICATION (Left) CYSTOSCOPY/RETROGRADE/URETEROSCOPY/STONE EXTRACTION WITH BASKET (Left) CYSTOSCOPY WITH STENT PLACEMENT (Left)  Patient Location: PACU  Anesthesia Type:General  Level of Consciousness: sedated  Airway & Oxygen Therapy: Patient Spontanous Breathing and Patient connected to face mask oxygen  Post-op Assessment: Report given to PACU RN and Post -op Vital signs reviewed and stable  Post vital signs: Reviewed and stable  Complications: No apparent anesthesia complications

## 2012-08-04 NOTE — Op Note (Signed)
Corey Zamora is a 72 y.o.   08/04/2012  General  Pre-op diagnosis: left distal ureteral calculus, bilateral renal calculi, left hydronephrosis  Postop diagnosis: Same  Procedure done: Cystoscopy, left retrograde pyelogram, ureteroscopy, holmium laser left distal ureteral calculus, stone extraction, insertion of double-J stent  Surgeon: Corey Zamora. Corey Zamora  Anesthesia: General  Indication: Patient is a 72 years old male who was seen in the emergency room about 2 weeks ago with sudden onset of severe left flank pain associated with nausea and vomiting. CT scan showed bilateral nonobstructing renal calculi, moderate left hydronephrosis secondary to a 10 x 6 mm stone at the left distal ureter. He has been on Flomax and has not passed the stone. KUB yesterday showed the stone in the same location. He is scheduled today for cystoscopy, retrograde pyelogram, ureteroscopy, holmium laser of ureteral stone, stone extraction and insertion of double-J stent  Procedure: Patient was identified by his wrist band and proper timeout was taken.  Under general anesthesia he was prepped and draped and placed in the dorsolithotomy position. A panendoscope was inserted in the bladder. The anterior urethra is normal. He has trilobar prostatic hypertrophy. The bladder is normal. There is no stone or tumor in the bladder. The ureteral orifices are in normal position and shape.  Left retrograde pyelogram:  A cone-tip catheter was passed through the cystoscope and the left ureteral orifice. 3 cc of Omnipaque were then injected through the cone-tip catheter. There is a filling defect in the distal ureter at about 3 cm above the ureteral orifice consistent with the known ureteral stone. There is moderate hydronephrosis proximal to the filling defect. I did not inject any more contrast to visualize the mid and proximal ureter. The cone-tip catheter was then removed.  A sensor wire was passed through the cystoscope and the  left ureter. The cystoscope was removed.  A semirigid ureteroscope was then passed in the bladder and through the left ureteral orifice. The ureteroscope would not pass the intramural ureter. I then removed the ureteroscope. I dilated the intramural ureter with the inner sheath of a ureteroscope access sheath. The ureteroscope access sheath was removed. The ureteroscope was then passed in the bladder and through the ureter without difficulty. The stone was then visualized in the ureter. It is a relatively large stone. With a 365 microfiber holmium laser at a setting of 0.5 J the stone was fragmented in multiple smaller stone fragments. The stone fragments were then removed with the Nitinol basket. Dust of stone was seen in the ureter. Those fragments  will pass without difficulty.  Contrast was injected through the ureteroscope. There is no evidence of filling defect in the ureter and there is no extravasation of contrast. The mid and proximal ureter are moderately dilated. The ureteroscope was then removed.  The sensor wire was then backloaded into the cystoscope and a #6 Zamora last 24 double-J stent was passed over the sensor wire. When the tip of the stent was noted in the renal pelvis the sensor wire was removed. The proximal curl of the double-J stent is in the renal pelvis and the distal curl is in the bladder. The bladder was then emptied and the cystoscope removed.  The patient tolerated the procedure well and left the OR in satisfactory condition to postanesthesia care unit.

## 2012-08-04 NOTE — Anesthesia Preprocedure Evaluation (Addendum)
Anesthesia Evaluation  Patient identified by MRN, date of birth, ID band Patient awake    Reviewed: Allergy & Precautions, H&P , NPO status , Patient's Chart, lab work & pertinent test results  Airway Mallampati: I TM Distance: >3 FB Neck ROM: Full    Dental  (+) Dental Advisory Given, Edentulous Upper and Edentulous Lower   Pulmonary neg pulmonary ROS,  breath sounds clear to auscultation        Cardiovascular negative cardio ROS  Rhythm:Regular Rate:Normal     Neuro/Psych negative neurological ROS  negative psych ROS   GI/Hepatic negative GI ROS, Neg liver ROS,   Endo/Other  negative endocrine ROS  Renal/GU Renal diseaseNephrolithiasis     Musculoskeletal negative musculoskeletal ROS (+)   Abdominal   Peds  Hematology negative hematology ROS (+)   Anesthesia Other Findings   Reproductive/Obstetrics                          Anesthesia Physical Anesthesia Plan  ASA: II  Anesthesia Plan: General   Post-op Pain Management:    Induction: Intravenous  Airway Management Planned: LMA  Additional Equipment:   Intra-op Plan:   Post-operative Plan: Extubation in OR  Informed Consent: I have reviewed the patients History and Physical, chart, labs and discussed the procedure including the risks, benefits and alternatives for the proposed anesthesia with the patient or authorized representative who has indicated his/her understanding and acceptance.   Dental advisory given  Plan Discussed with: CRNA  Anesthesia Plan Comments:         Anesthesia Quick Evaluation

## 2012-08-04 NOTE — Anesthesia Postprocedure Evaluation (Signed)
Anesthesia Post Note  Patient: Corey Zamora  Procedure(s) Performed: Procedure(s) (LRB): HOLMIUM LASER APPLICATION (Left) CYSTOSCOPY/RETROGRADE/URETEROSCOPY/STONE EXTRACTION WITH BASKET (Left) CYSTOSCOPY WITH STENT PLACEMENT (Left)  Anesthesia type: General  Patient location: PACU  Post pain: Pain level controlled  Post assessment: Post-op Vital signs reviewed  Last Vitals: BP 119/61  Pulse 51  Temp(Src) 36.3 C  Resp 11  Wt 165 lb (74.844 kg)  BMI 26.64 kg/m2  SpO2 100%  Post vital signs: Reviewed  Level of consciousness: sedated  Complications: No apparent anesthesia complications

## 2012-08-04 NOTE — Anesthesia Procedure Notes (Signed)
Procedure Name: LMA Insertion Date/Time: 08/04/2012 7:44 AM Performed by: Burna Cash Pre-anesthesia Checklist: Patient identified, Emergency Drugs available, Suction available and Patient being monitored Patient Re-evaluated:Patient Re-evaluated prior to inductionOxygen Delivery Method: Circle System Utilized Preoxygenation: Pre-oxygenation with 100% oxygen Intubation Type: IV induction Ventilation: Mask ventilation without difficulty LMA: LMA inserted LMA Size: 5.0 Number of attempts: 1 Airway Equipment and Method: bite block Placement Confirmation: positive ETCO2 Tube secured with: Tape Dental Injury: Teeth and Oropharynx as per pre-operative assessment

## 2012-08-04 NOTE — H&P (Signed)
History of Present Illness  Mr Jasinski was seen in the ER on 5/5 with sudden onset of severe left flank pain associated with nausea and vomiting.  CT showed bilateral non obstructing renal calculi, moderate left hydronephrosis and a 10 x 6 mm stone at the left distal ureter.  He is on Flomax and has not passed a stone. He has not had any pain either. KUB showed several calcifications in the bony pelvis, one of which is a large stone in the distal ureter.  He has apast history of kidney stone, the last one about ten years ago.   Past Medical History Problems  1. History of  Nephrolithiasis V13.01  Surgical History Problems  1. History of  Shoulder Surgery  Current Meds 1. No Reported Medications  Allergies Medication  1. Codeine Derivatives 2. Penicillins  Family History Problems  1. Family history of  Family Health Status Number Of Children 3 sons, 1 daughter 2. Family history of  Father Deceased At Age 46 3. Family history of  Mother Deceased At Age 81 4. Fraternal history of  Nephrolithiasis 5. Fraternal history of  Prostate Cancer V16.42 6. Paternal history of  Transient Ischemic Attack  Social History Problems    Caffeine Use 12-15 oz per day   Marital History - Divorced V61.03   Never A Smoker   Occupation: Retired Denied    History of  Alcohol Use  Review of Systems Genitourinary, constitutional, skin, eye, otolaryngeal, hematologic/lymphatic, cardiovascular, pulmonary, endocrine, musculoskeletal, gastrointestinal, neurological and psychiatric system(s) were reviewed and pertinent findings if present are noted.  Genitourinary: nocturia and urinary stream starts and stops.  Gastrointestinal: nausea and vomiting.    Vitals Vital Signs [Data Includes: Last 1 Day]  15May2014 01:42PM  BMI Calculated: 27.48 BSA Calculated: 1.87 Height: 5 ft 6 in Weight: 171 lb  Blood Pressure: 131 / 81 Heart Rate: 61  Physical Exam Constitutional: Well nourished and well  developed . No acute distress.  ENT:. The ears and nose are normal in appearance.  Neck: The appearance of the neck is normal and no neck mass is present.  Pulmonary: No respiratory distress and normal respiratory rhythm and effort.  Cardiovascular: Heart rate and rhythm are normal . No peripheral edema.  Abdomen: The abdomen is soft and nontender. No masses are palpated. No CVA tenderness. No hernias are palpable. No hepatosplenomegaly noted.  Genitourinary: Examination of the penis demonstrates no discharge, no masses, no lesions and a normal meatus. The scrotum is without lesions. The right epididymis is palpably normal and non-tender. The left epididymis is palpably normal and non-tender. The right testis is non-tender and without masses. The left testis is non-tender and without masses.  Lymphatics: The femoral and inguinal nodes are not enlarged or tender.  Skin: Normal skin turgor, no visible rash and no visible skin lesions.  Neuro/Psych:. Mood and affect are appropriate.    Results/Data Urine [Data Includes: Last 1 Day]   15May2014  COLOR YELLOW   APPEARANCE CLEAR   SPECIFIC GRAVITY 1.020   pH 6.0   GLUCOSE NEG mg/dL  BILIRUBIN NEG   KETONE NEG mg/dL  BLOOD TRACE   PROTEIN NEG mg/dL  UROBILINOGEN 0.2 mg/dL  NITRITE NEG   LEUKOCYTE ESTERASE NEG   SQUAMOUS EPITHELIAL/HPF RARE   WBC 0-2 WBC/hpf  RBC 0-2 RBC/hpf  BACTERIA NONE SEEN   CRYSTALS NONE SEEN   CASTS NONE SEEN    KUB INDICATION: Left distal ureteral calculus. KUB shows normal bowel gas pattern.  Psoas shadows  are normal.  The kidneys are obscured by gas and stools in the colon.  I can not identify opaque stones in either kidneys.  There are several bilateral calcifications in the bony pelvis that represent phleboliths and a 10 x 6 mm calcification in the left distal ureter. IMPRESSION: Left distal ureteral calculus.   I independently reviewed the CT scan and the findings are as noted in the HPI.      Assessment Assessed  1. Distal Ureteral Stone On The Left 592.1 2. Nephrolithiasis Of Both Kidneys 592.0  Plan Distal Ureteral Stone On The Left (592.1)  1. KUB  Done: 15May2014 12:00AM Health Maintenance (V70.0)  2. UA With REFLEX  Done: 15May2014 01:15PM   The stone is relatively large.  I doubt he will pass it.  He needs stone manipulation.  The procedure, risks, benefits were discussed with the patient, his wife and daughter.  The risks include but are not limited to hemorrhage, infection, ureteral injury, inability to remove stone.  they understand and wish to proceed.   Signatures Electronically signed by : Su Grand, M.D.; Aug 03 2012  5:18PM

## 2012-08-07 ENCOUNTER — Encounter (HOSPITAL_BASED_OUTPATIENT_CLINIC_OR_DEPARTMENT_OTHER): Payer: Self-pay | Admitting: Urology

## 2012-08-11 DIAGNOSIS — N201 Calculus of ureter: Secondary | ICD-10-CM | POA: Diagnosis not present

## 2012-08-11 DIAGNOSIS — N200 Calculus of kidney: Secondary | ICD-10-CM | POA: Diagnosis not present

## 2012-12-12 IMAGING — CT CT ABD-PELV W/O CM
2 of 3 series · 17 of 46 positions shown, 19 images · non-contrast
Comparison: 11/08/2009

CLINICAL DATA: Chest pain and right sided abdominal pain.  History
of renal stones.

CT ABDOMEN AND PELVIS WITHOUT CONTRAST
TECHNIQUE: Multidetector CT imaging of the abdomen and pelvis was
performed following the standard protocol without intravenous
contrast.

[Series 2: standard/full over (age)lbs 5.0 · axial · 0.75mm/px · z∈[-566,-160]mm · 14 of 94 slices shown, 16 images]
[im 7/94  soft-tissue]
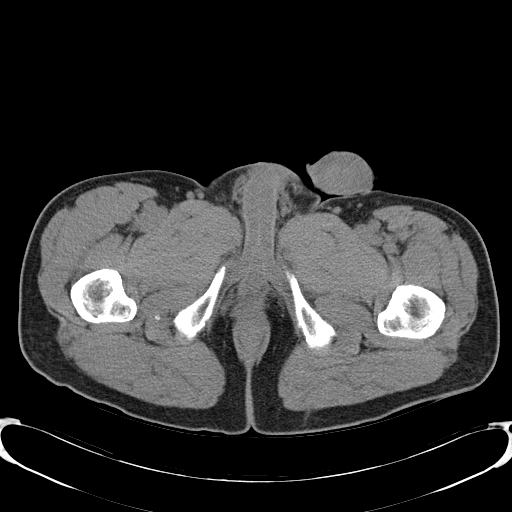
[im 7/94  bone]
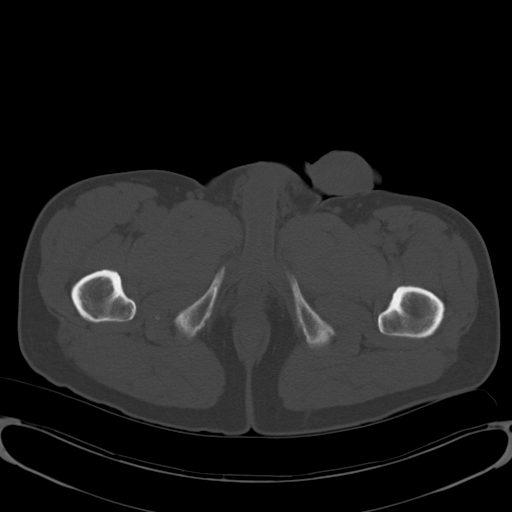
[im 13/94  soft-tissue]
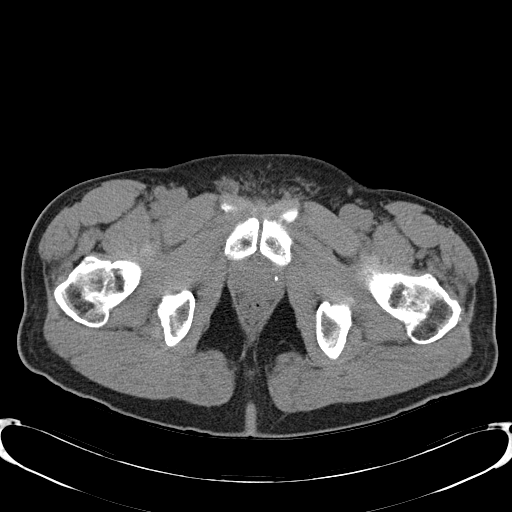
[im 19/94  soft-tissue]
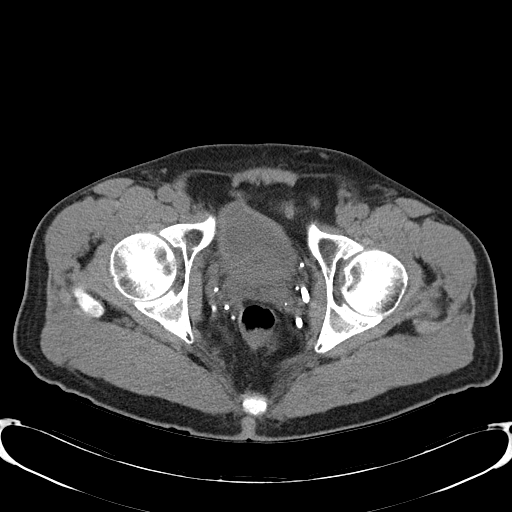
[im 25/94  soft-tissue]
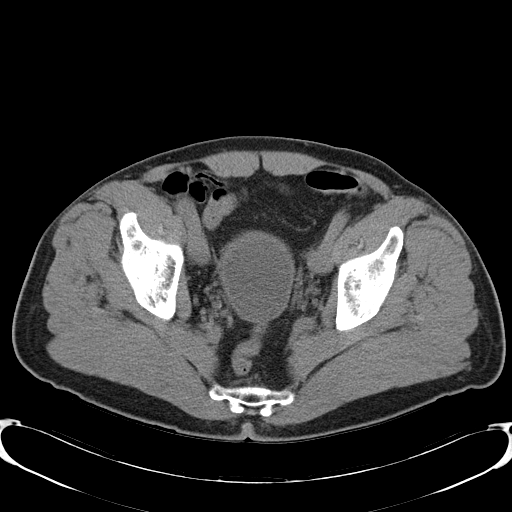
[im 31/94  soft-tissue]
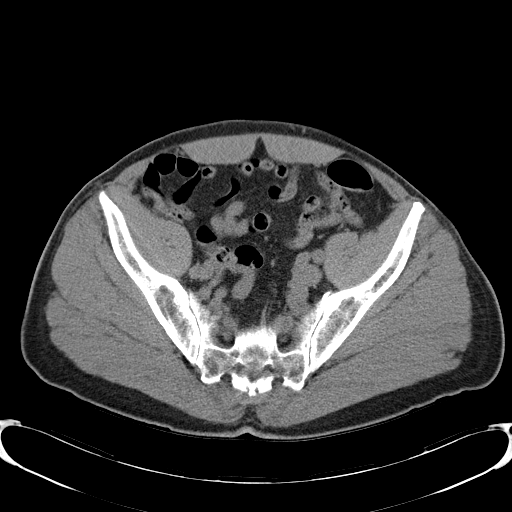
[im 37/94  soft-tissue]
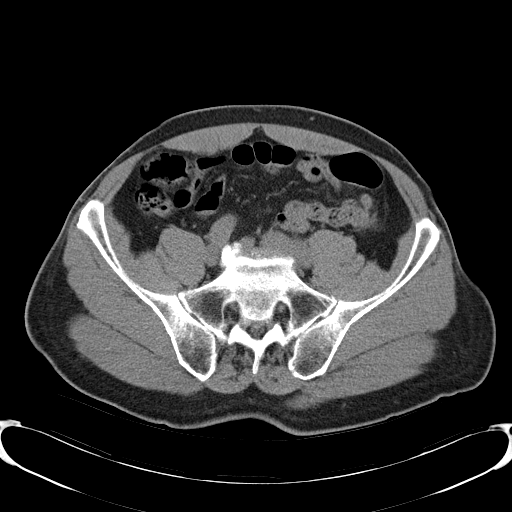
[im 43/94  soft-tissue]
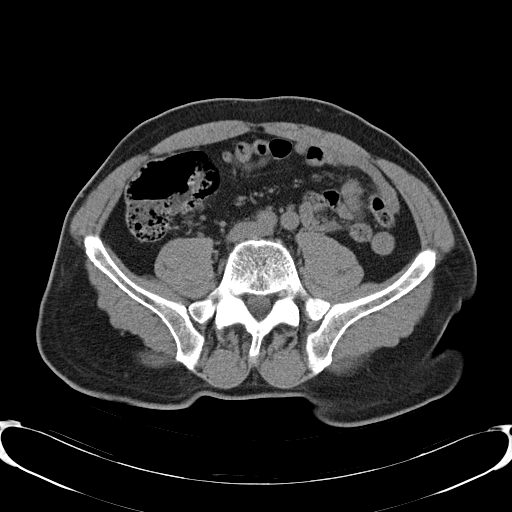
[im 52/94  soft-tissue]
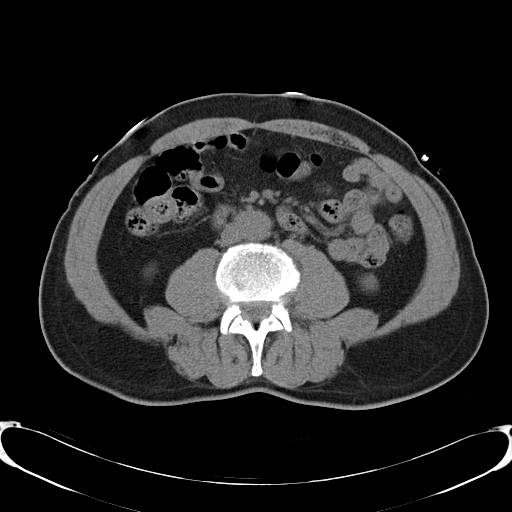
[im 58/94  soft-tissue]
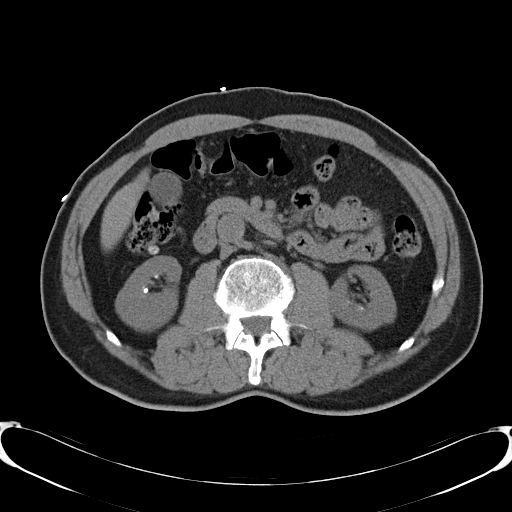
[im 58/94  bone]
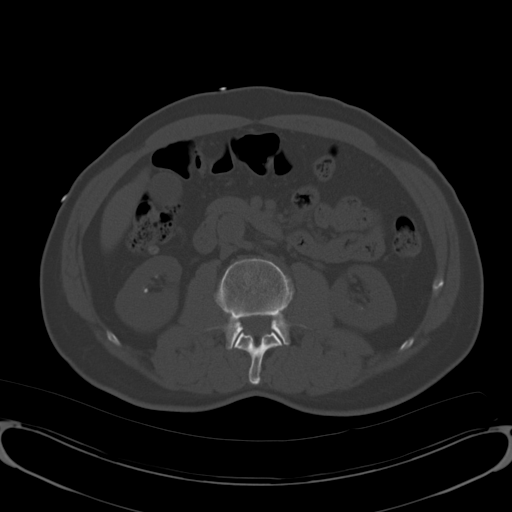
[im 64/94  soft-tissue]
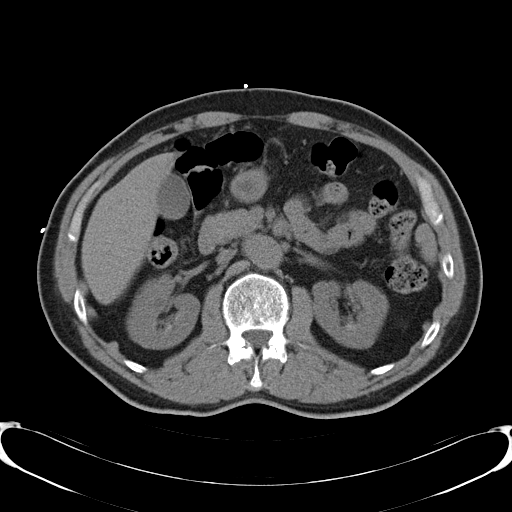
[im 70/94  soft-tissue]
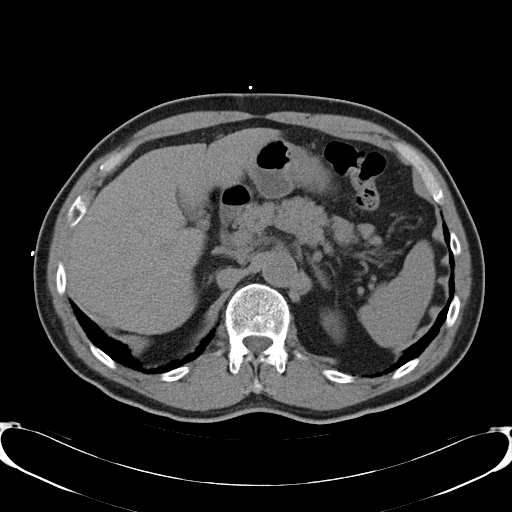
[im 76/94  soft-tissue]
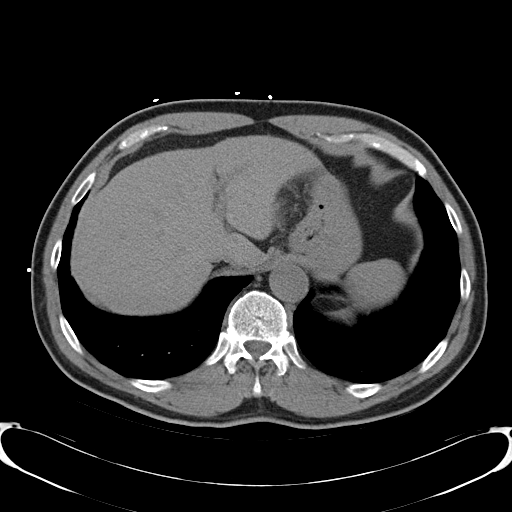
[im 82/94  soft-tissue]
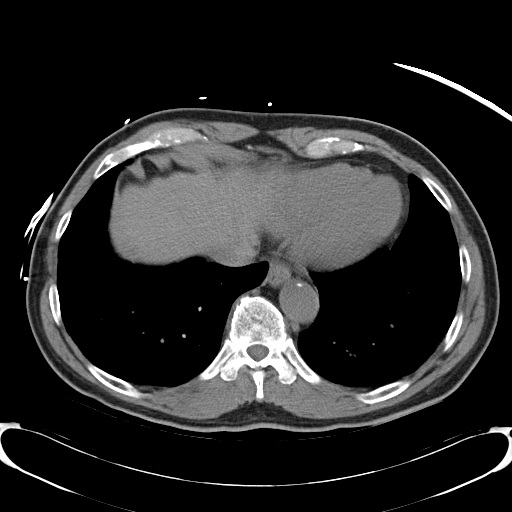
[im 88/94  soft-tissue]
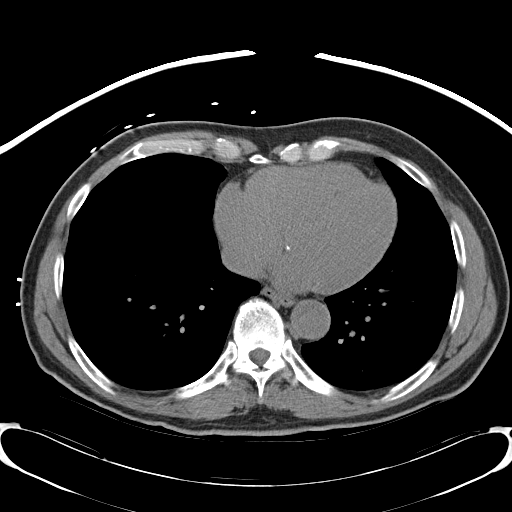

[Series 4: mpr coronal · coronal · 0.69mm/px · 3 of 78 slices shown]
[im 26/78  soft-tissue]
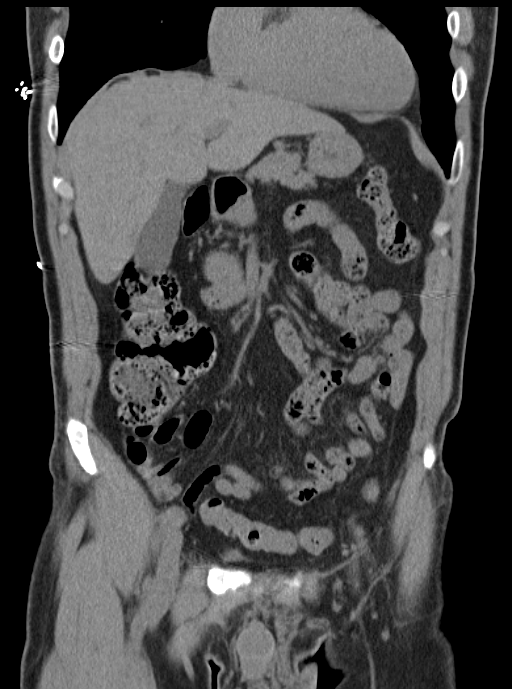
[im 35/78  soft-tissue]
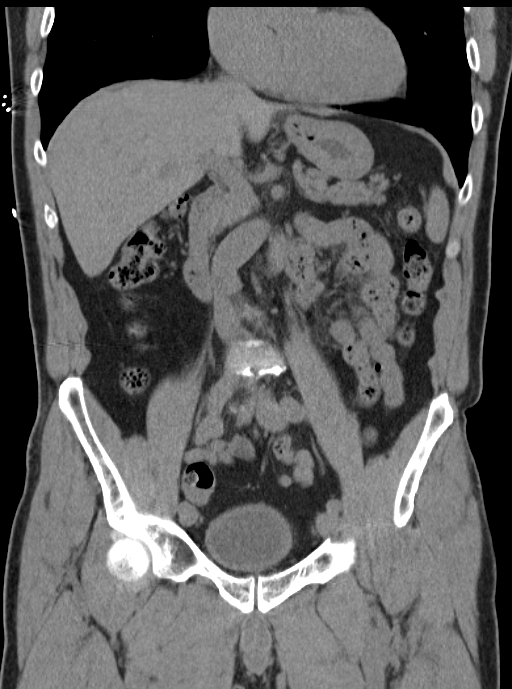
[im 43/78  soft-tissue]
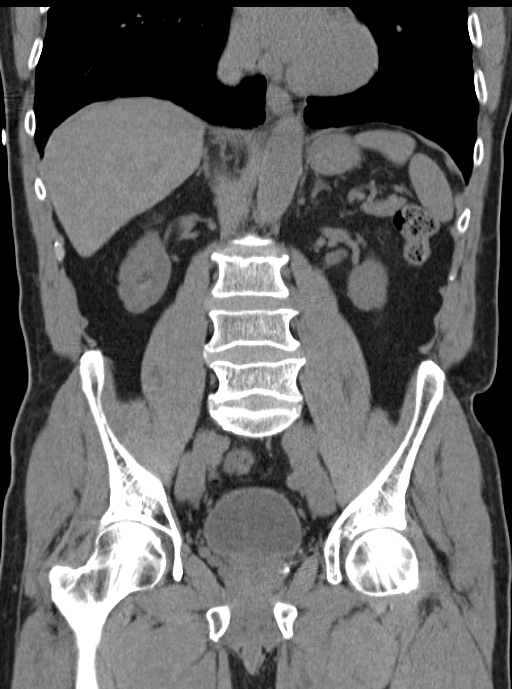

[17 of 46 positions shown; findings below may reference images not displayed]

FINDINGS: Lung bases show mild dependent atelectasis and/or
scarring.  Heart size normal.  No pericardial or pleural effusion.
Tiny hiatal hernia.

Liver, gallbladder and adrenal glands are unremarkable.  Stones are
seen in the kidneys bilaterally.  Ureters are decompressed.
Spleen, pancreas, stomach and bowel are unremarkable.  Minimal
scattered atherosclerotic calcification of the arterial
vasculature.  No pathologically enlarged lymph nodes.  No free
fluid.  No worrisome lytic or sclerotic lesions.
IMPRESSION: Bilateral nonobstructing nephrolithiasis.

## 2013-01-19 DIAGNOSIS — Z23 Encounter for immunization: Secondary | ICD-10-CM | POA: Diagnosis not present

## 2013-03-07 ENCOUNTER — Encounter (INDEPENDENT_AMBULATORY_CARE_PROVIDER_SITE_OTHER): Payer: Self-pay | Admitting: *Deleted

## 2013-03-27 ENCOUNTER — Encounter (INDEPENDENT_AMBULATORY_CARE_PROVIDER_SITE_OTHER): Payer: Self-pay | Admitting: Internal Medicine

## 2013-03-27 ENCOUNTER — Ambulatory Visit (INDEPENDENT_AMBULATORY_CARE_PROVIDER_SITE_OTHER): Payer: Medicare Other | Admitting: Internal Medicine

## 2013-03-27 ENCOUNTER — Encounter (INDEPENDENT_AMBULATORY_CARE_PROVIDER_SITE_OTHER): Payer: Self-pay | Admitting: *Deleted

## 2013-03-27 ENCOUNTER — Other Ambulatory Visit (INDEPENDENT_AMBULATORY_CARE_PROVIDER_SITE_OTHER): Payer: Self-pay | Admitting: *Deleted

## 2013-03-27 VITALS — BP 122/70 | HR 60 | Temp 98.1°F | Ht 65.0 in | Wt 169.6 lb

## 2013-03-27 DIAGNOSIS — K2270 Barrett's esophagus without dysplasia: Secondary | ICD-10-CM | POA: Insufficient documentation

## 2013-03-27 DIAGNOSIS — K219 Gastro-esophageal reflux disease without esophagitis: Secondary | ICD-10-CM

## 2013-03-27 DIAGNOSIS — R1310 Dysphagia, unspecified: Secondary | ICD-10-CM

## 2013-03-27 MED ORDER — PANTOPRAZOLE SODIUM 40 MG PO TBEC: 40.0000 mg | DELAYED_RELEASE_TABLET | Freq: Every day | ORAL | Status: DC

## 2013-03-27 NOTE — Patient Instructions (Addendum)
Rx for Protonix 40mg  sent to his pharmacy. Patient needs EGD/ED

## 2013-03-27 NOTE — Progress Notes (Signed)
Subjective:     Patient ID: Corey Zamora, male   DOB: September 12, 1940, 73 y.o.   MRN: [id number]  HPI Here today for f/u of his dysphagia/Barrett's esophagus.  He tells me when he eats if he doesn't drink a lot of fluids, the bolus will stop in his lower esophagus. Appetite has remained good. He is not on a PPI. No weight loss. Has a BM x 1 a day. No melena or bright red rectal bleeding.  He says he saw blood x 1 last month from a hemorrhoid.  Family hx of colon cancer in a brother.  01/2011 EGD/Colonoscopy: Cecal Withdrawal Time: 17 minutes  Impression:  Erosive reflux esophagitis without obvious ring or stricture formation. Esophagus dilated by passing 56 Meter Maloney dilator; no mucosal disruption noted.  Small patch of salmon-colored mucosa at GE junction biopsied for histology.  Erosive antral gastritis  Small polyp ablated via cold biopsy from ascending colon.  Two diverticula at hepatic flexure.  Small external hemorrhoids.  Biopsy: H. pylori serology is negative. Biopsy from GE junction shows inflammation and Barrett's esophagus. Colonic polyp is an adenoma.      11/2005 EGD/Colonoscopy: 1. Normal esophagogastroduodenoscopy. Esophagus dilated by passing 56  Free Maloney dilator given history of solid food dysphagia.  2. A few small diverticula at sigmoid colon, otherwise, normal  examination.   Review of Systems Divorced. Four children. Retired from [employer]. No current outpatient prescriptions on file.   No current facility-administered medications for this visit.   Past Medical History  Diagnosis Date  . Kidney stones    Past Surgical History  Procedure Laterality Date  . Right hand surgery    . Appendectomy    . Holmium laser application Left 06/28/8117    Procedure: HOLMIUM LASER APPLICATION;  Surgeon: Hanley Ben, MD;  Location: [place];  Service: Urology;  Laterality: Left;  . Cystoscopy/retrograde/ureteroscopy/stone  extraction with basket Left 08/04/2012    Procedure: CYSTOSCOPY/RETROGRADE/URETEROSCOPY/STONE EXTRACTION WITH BASKET;  Surgeon: Hanley Ben, MD;  Location: [place];  Service: Urology;  Laterality: Left;  . Cystoscopy with stent placement Left 08/04/2012    Procedure: CYSTOSCOPY WITH STENT PLACEMENT;  Surgeon: Hanley Ben, MD;  Location: [place];  Service: Urology;  Laterality: Left;   Allergies  Allergen Reactions  . Aspirin Hives  . Codeine Nausea And Vomiting  . Penicillins Hives       Objective:   Physical Exam  Filed Vitals:   03/27/13 1438  BP: 122/70  Pulse: 60  Temp: 98.1 F (36.7 C)  Height: 5\' 5"  (1.651 m)  Weight: 169 lb 9.6 oz (76.93 kg)        Assessment:    Barrett's esophagus.  Presently not taking a PPI for his Barrett's. Dysphagia to solids. Hx of erosive esophagitis. I discussed this case with Dr. Laural Golden.     Plan:    Needs surveillance EGD/EGD. Protonix 40mg  po daily 30 minutes before breakfast.   Samples of Dexilant x 3 boxes given to patient.

## 2013-04-05 ENCOUNTER — Encounter (HOSPITAL_COMMUNITY): Payer: Self-pay | Admitting: Pharmacy Technician

## 2013-04-09 DIAGNOSIS — J310 Chronic rhinitis: Secondary | ICD-10-CM | POA: Diagnosis not present

## 2013-04-09 DIAGNOSIS — N200 Calculus of kidney: Secondary | ICD-10-CM | POA: Diagnosis not present

## 2013-04-09 DIAGNOSIS — I4510 Unspecified right bundle-branch block: Secondary | ICD-10-CM | POA: Diagnosis not present

## 2013-04-09 DIAGNOSIS — Z79899 Other long term (current) drug therapy: Secondary | ICD-10-CM | POA: Diagnosis not present

## 2013-04-09 DIAGNOSIS — K2270 Barrett's esophagus without dysplasia: Secondary | ICD-10-CM | POA: Diagnosis not present

## 2013-04-16 DIAGNOSIS — K2270 Barrett's esophagus without dysplasia: Secondary | ICD-10-CM | POA: Diagnosis not present

## 2013-04-16 DIAGNOSIS — N200 Calculus of kidney: Secondary | ICD-10-CM | POA: Diagnosis not present

## 2013-04-16 DIAGNOSIS — I4510 Unspecified right bundle-branch block: Secondary | ICD-10-CM | POA: Diagnosis not present

## 2013-04-16 DIAGNOSIS — Z1212 Encounter for screening for malignant neoplasm of rectum: Secondary | ICD-10-CM | POA: Diagnosis not present

## 2013-04-16 DIAGNOSIS — Z0000 Encounter for general adult medical examination without abnormal findings: Secondary | ICD-10-CM | POA: Diagnosis not present

## 2013-04-19 ENCOUNTER — Ambulatory Visit (HOSPITAL_COMMUNITY)
Admission: RE | Admit: 2013-04-19 | Discharge: 2013-04-19 | Disposition: A | Discharge status: Home / Self Care | Payer: Medicare Other | Source: Ambulatory Visit | Attending: Internal Medicine | Admitting: Internal Medicine

## 2013-04-19 ENCOUNTER — Encounter (HOSPITAL_COMMUNITY): Payer: Self-pay

## 2013-04-19 ENCOUNTER — Encounter (HOSPITAL_COMMUNITY)
Admission: RE | Disposition: A | Discharge status: Home / Self Care | Payer: Self-pay | Source: Ambulatory Visit | Attending: Internal Medicine

## 2013-04-19 DIAGNOSIS — K319 Disease of stomach and duodenum, unspecified: Secondary | ICD-10-CM

## 2013-04-19 DIAGNOSIS — R1310 Dysphagia, unspecified: Secondary | ICD-10-CM | POA: Insufficient documentation

## 2013-04-19 DIAGNOSIS — K31819 Angiodysplasia of stomach and duodenum without bleeding: Secondary | ICD-10-CM | POA: Insufficient documentation

## 2013-04-19 DIAGNOSIS — K2270 Barrett's esophagus without dysplasia: Secondary | ICD-10-CM

## 2013-04-19 HISTORY — PX: ESOPHAGOGASTRODUODENOSCOPY (EGD) WITH ESOPHAGEAL DILATION: SHX5812

## 2013-04-19 SURGERY — ESOPHAGOGASTRODUODENOSCOPY (EGD) WITH ESOPHAGEAL DILATION
Anesthesia: Moderate Sedation

## 2013-04-19 MED ORDER — BUTAMBEN-TETRACAINE-BENZOCAINE 2-2-14 % EX AERO
INHALATION_SPRAY | CUTANEOUS | Status: DC | PRN
  Administered 2013-04-19: 2 via TOPICAL

## 2013-04-19 MED ORDER — MEPERIDINE HCL 50 MG/ML IJ SOLN
INTRAMUSCULAR | Status: AC
  Filled 2013-04-19: qty 1

## 2013-04-19 MED ORDER — MEPERIDINE HCL 25 MG/ML IJ SOLN
INTRAMUSCULAR | Status: DC | PRN
  Administered 2013-04-19 (×2): 25 mg via INTRAVENOUS

## 2013-04-19 MED ORDER — STERILE WATER FOR IRRIGATION IR SOLN
Status: DC | PRN
  Administered 2013-04-19: 08:00:00

## 2013-04-19 MED ORDER — SODIUM CHLORIDE 0.9 % IV SOLN
INTRAVENOUS | Status: DC
  Administered 2013-04-19: 07:00:00 via INTRAVENOUS

## 2013-04-19 MED ORDER — MIDAZOLAM HCL 5 MG/5ML IJ SOLN
INTRAMUSCULAR | Status: AC
  Filled 2013-04-19: qty 10

## 2013-04-19 MED ORDER — MIDAZOLAM HCL 5 MG/5ML IJ SOLN
INTRAMUSCULAR | Status: DC | PRN
  Administered 2013-04-19 (×2): 2 mg via INTRAVENOUS

## 2013-04-19 NOTE — H&P (Signed)
Corey Zamora is an 72 y.o. male.   Chief Complaint: Patient is here for EGD and ED. HPI: Patient is 73 year old Caucasian male who has history of GERD complicated by short segment Barrett's esophagus who presents with solid food dysphagia which started a couple of months ago. He has not experienced an episode where he had to regurgitate in order to get relief. He states [place] controlled with pantoprazole. He has good appetite. He denies abdominal pain melena or weight loss. He has dentures but she does not use. Since he cannot shoot me he stays away from steaks. Last EGD, ED was in November 2012.  Past Medical History  Diagnosis Date  . Kidney stones     Past Surgical History  Procedure Laterality Date  . Right hand surgery    . Appendectomy    . Holmium laser application Left 0/62/3762    Procedure: HOLMIUM LASER APPLICATION;  Surgeon: Hanley Ben, MD;  Location: [place];  Service: Urology;  Laterality: Left;  . Cystoscopy/retrograde/ureteroscopy/stone extraction with basket Left 08/04/2012    Procedure: CYSTOSCOPY/RETROGRADE/URETEROSCOPY/STONE EXTRACTION WITH BASKET;  Surgeon: Hanley Ben, MD;  Location: [place];  Service: Urology;  Laterality: Left;  . Cystoscopy with stent placement Left 08/04/2012    Procedure: CYSTOSCOPY WITH STENT PLACEMENT;  Surgeon: Hanley Ben, MD;  Location: [place];  Service: Urology;  Laterality: Left;    Family History  Problem Relation Age of Onset  . Colon cancer Brother    Social History:  reports that he has never smoked. His smokeless tobacco use includes Chew. He reports that he does not drink alcohol or use illicit drugs.  Allergies:  Allergies  Allergen Reactions  . Aspirin Hives  . Codeine Nausea And Vomiting  . Penicillins Hives    Medications Prior to Admission  Medication Sig Dispense Refill  . Ascorbic Acid (VITAMIN C) 1000 MG tablet Take 1,000 mg by mouth  daily.      . pantoprazole (PROTONIX) 40 MG tablet Take 1 tablet (40 mg total) by mouth daily.  90 tablet  3  . vitamin B-12 (CYANOCOBALAMIN) 1000 MCG tablet Take 1,000 mcg by mouth daily.      . vitamin E 400 UNIT capsule Take 400 Units by mouth daily.        No results found for this or any previous visit (from the past 48 hour(s)). No results found.  ROS  Blood pressure 134/69, pulse 52, temperature 97.6 F (36.4 C), temperature source Oral, resp. rate 16, height 5\' 5"  (1.651 m), weight 162 lb (73.483 kg). Physical Exam  Constitutional: He appears well-developed and well-nourished.  HENT:  Mouth/Throat: Oropharynx is clear and moist.  Eyes: Conjunctivae are normal. No scleral icterus.  Neck: No thyromegaly present.  Cardiovascular: Normal rate, regular rhythm and normal heart sounds.   No murmur heard. Respiratory: Effort normal and breath sounds normal.  GI: Soft. He exhibits no distension and no mass. There is no tenderness.  Musculoskeletal: He exhibits no edema.  Lymphadenopathy:    He has no cervical adenopathy.  Neurological: He is alert.  Skin: Skin is warm and dry.     Assessment/Plan Solid food dysphagia in a patient with history of GERD and short segment Barrett's esophagus. EGD and ED.  Andrzej Scully U 04/19/2013, 7:34 AM

## 2013-04-19 NOTE — Op Note (Signed)
EGD PROCEDURE REPORT  PATIENT:  Corey Zamora  MR#:  [id number] Birthdate:  04/25/1940, 73 y.o., male Endoscopist:  Dr. Rogene Houston, MD Referred By:  Dr. Asencion Noble, MD  Procedure Date: 04/19/2013  Procedure:   EGD  ED.  Indications:  Patient is 15 old Caucasian male who presents with intermittent solid food dysphagia. He has chronic GERD complicated by short segment Barrett's esophagus and his last exam was in November 2012. He had erosive esophagitis. His esophagus was dilated with improvement in dysphagia            Informed Consent:  The risks, benefits, alternatives & imponderables which include, but are not limited to, bleeding, infection, perforation, drug reaction and potential missed lesion have been reviewed.  The potential for biopsy, lesion removal, esophageal dilation, etc. have also been discussed.  Questions have been answered.  All parties agreeable.  Please see history & physical in medical record for more information.  Medications:  Demerol 50 mg IV Versed 4 mg IV Cetacaine spray topically for oropharyngeal anesthesia  Description of procedure:  The endoscope was introduced through the mouth and advanced to the second portion of the duodenum without difficulty or limitations. The mucosal surfaces were surveyed very carefully during advancement of the scope and upon withdrawal.  Findings:  Esophagus:  Mucosa of the esophagus was normal. 2 small islands of salmon-colored mucosa noted previously documented to be Barrett's mucosa. GEJ:  41 cm Stomach:  Stomach was empty and distended with insufflation. Folds in the proximal stomach are normal. There was single small AV malformation along posterior wall of the gastric body. There was antral scar but no evidence of erosions or ulcers.  Duodenum:   normal bulbar and post bulbar mucosa.  Therapeutic/Diagnostic Maneuvers Performed:   Esophagus was dilated by passing 56 [place] Maloney dilator to full insertion. Esophageal  mucosa was reexamined post dilation and no mucosal disruption noted.   Complications:   none   Impression: Erosive esophagitis has completely healed with short segment Barrett's.  No evidence of esophageal ring or stricture. Esophagus dilated by passing 56 [place] Maloney dilator. Single small AVM at gastric body and antral scar.  Patient noted to have sinus bradycardia on preop evaluation with physiologic response to physical activity   Recommendations:  Standard instructions given. Patient advised to follow with Dr. Willey Blade if he develops postural symptoms.  Shanara Schnieders U  04/19/2013  7:57 AM  CC: Dr. Asencion Noble, MD & Dr. Rayne Du ref. provider found

## 2013-04-19 NOTE — Discharge Instructions (Signed)
Resume usual medications and diet. No driving for 24 hours. Call office with progress report in one week.   Esophagogastroduodenoscopy Care After Refer to this sheet in the next few weeks. These instructions provide you with information on caring for yourself after your procedure. Your caregiver may also give you more specific instructions. Your treatment has been planned according to current medical practices, but problems sometimes occur. Call your caregiver if you have any problems or questions after your procedure.  HOME CARE INSTRUCTIONS  Do not eat or drink anything until the numbing medicine (local anesthetic) has worn off and your gag reflex has returned. You will know that the local anesthetic has worn off when you can swallow comfortably.  Do not drive for 12 hours after the procedure or as directed by your caregiver.  Only take medicines as directed by your caregiver. SEEK MEDICAL CARE IF:   You cannot stop coughing.  You are not urinating at all or less than usual. SEEK IMMEDIATE MEDICAL CARE IF:  You have difficulty swallowing.  You cannot eat or drink.  You have worsening throat or chest pain.  You have dizziness, lightheadedness, or you faint.  You have nausea or vomiting.  You have chills.  You have a fever.  You have severe abdominal pain.  You have black, tarry, or bloody stools. Document Released: 02/23/2012 Document Reviewed: 02/23/2012 [facility] Patient Information 2014 [place], [place].

## 2013-04-23 ENCOUNTER — Encounter (HOSPITAL_COMMUNITY): Payer: Self-pay | Admitting: Internal Medicine

## 2013-05-02 ENCOUNTER — Telehealth (INDEPENDENT_AMBULATORY_CARE_PROVIDER_SITE_OTHER): Payer: Self-pay | Admitting: *Deleted

## 2013-05-02 NOTE — Telephone Encounter (Signed)
Glad to know that he is swallowing better. Followup when necessary

## 2013-05-02 NOTE — Telephone Encounter (Signed)
Forwarded to Dr.Rehman as FYI. 

## 2013-05-02 NOTE — Telephone Encounter (Signed)
Corey Zamora called to let Dr.Rehman know her dad is going really good. The procedure seemed to help and he currently taking the medication given to him. If there are any questions, she can be reached at [phone].

## 2013-05-03 NOTE — Telephone Encounter (Signed)
Corey Zamora was advised for Corey Zamora to call as needed. Voices understood.

## 2013-11-22 DIAGNOSIS — H4720 Unspecified optic atrophy: Secondary | ICD-10-CM | POA: Diagnosis not present

## 2014-01-14 DIAGNOSIS — Z23 Encounter for immunization: Secondary | ICD-10-CM | POA: Diagnosis not present

## 2014-04-08 ENCOUNTER — Other Ambulatory Visit (INDEPENDENT_AMBULATORY_CARE_PROVIDER_SITE_OTHER): Payer: Self-pay | Admitting: Internal Medicine

## 2014-04-19 DIAGNOSIS — J310 Chronic rhinitis: Secondary | ICD-10-CM | POA: Diagnosis not present

## 2014-04-19 DIAGNOSIS — Z79899 Other long term (current) drug therapy: Secondary | ICD-10-CM | POA: Diagnosis not present

## 2014-04-19 DIAGNOSIS — Z125 Encounter for screening for malignant neoplasm of prostate: Secondary | ICD-10-CM | POA: Diagnosis not present

## 2014-04-19 DIAGNOSIS — K2270 Barrett's esophagus without dysplasia: Secondary | ICD-10-CM | POA: Diagnosis not present

## 2014-04-19 DIAGNOSIS — I4510 Unspecified right bundle-branch block: Secondary | ICD-10-CM | POA: Diagnosis not present

## 2014-04-26 DIAGNOSIS — Z23 Encounter for immunization: Secondary | ICD-10-CM | POA: Diagnosis not present

## 2014-04-26 DIAGNOSIS — Z0001 Encounter for general adult medical examination with abnormal findings: Secondary | ICD-10-CM | POA: Diagnosis not present

## 2015-02-03 DIAGNOSIS — Z23 Encounter for immunization: Secondary | ICD-10-CM | POA: Diagnosis not present

## 2015-04-21 DIAGNOSIS — I4510 Unspecified right bundle-branch block: Secondary | ICD-10-CM | POA: Diagnosis not present

## 2015-04-21 DIAGNOSIS — Z79899 Other long term (current) drug therapy: Secondary | ICD-10-CM | POA: Diagnosis not present

## 2015-04-21 DIAGNOSIS — K2270 Barrett's esophagus without dysplasia: Secondary | ICD-10-CM | POA: Diagnosis not present

## 2015-04-21 DIAGNOSIS — Z125 Encounter for screening for malignant neoplasm of prostate: Secondary | ICD-10-CM | POA: Diagnosis not present

## 2015-04-28 DIAGNOSIS — K219 Gastro-esophageal reflux disease without esophagitis: Secondary | ICD-10-CM | POA: Diagnosis not present

## 2015-04-28 DIAGNOSIS — N200 Calculus of kidney: Secondary | ICD-10-CM | POA: Diagnosis not present

## 2015-04-28 DIAGNOSIS — I452 Bifascicular block: Secondary | ICD-10-CM | POA: Diagnosis not present

## 2015-04-28 DIAGNOSIS — Z6826 Body mass index (BMI) 26.0-26.9, adult: Secondary | ICD-10-CM | POA: Diagnosis not present

## 2015-09-13 ENCOUNTER — Other Ambulatory Visit (INDEPENDENT_AMBULATORY_CARE_PROVIDER_SITE_OTHER): Payer: Self-pay | Admitting: Internal Medicine

## 2015-10-30 DIAGNOSIS — H47293 Other optic atrophy, bilateral: Secondary | ICD-10-CM | POA: Diagnosis not present

## 2015-12-24 DIAGNOSIS — Z23 Encounter for immunization: Secondary | ICD-10-CM | POA: Diagnosis not present

## 2016-01-16 ENCOUNTER — Telehealth (INDEPENDENT_AMBULATORY_CARE_PROVIDER_SITE_OTHER): Payer: Self-pay | Admitting: *Deleted

## 2016-01-16 NOTE — Telephone Encounter (Signed)
He is due for TCS now or early next year.

## 2016-01-16 NOTE — Telephone Encounter (Signed)
Last TCS 2012 -- patient wants to know when he is due for next TCS -- please advise

## 2016-01-20 ENCOUNTER — Encounter (INDEPENDENT_AMBULATORY_CARE_PROVIDER_SITE_OTHER): Payer: Self-pay | Admitting: *Deleted

## 2016-01-20 ENCOUNTER — Telehealth (INDEPENDENT_AMBULATORY_CARE_PROVIDER_SITE_OTHER): Payer: Self-pay | Admitting: *Deleted

## 2016-01-20 MED ORDER — PEG 3350-KCL-NA BICARB-NACL 420 G PO SOLR: 4000.0000 mL | Freq: Once | ORAL | 0 refills | Status: AC

## 2016-01-20 NOTE — Telephone Encounter (Signed)
Patient needs trilyte 

## 2016-01-20 NOTE — Telephone Encounter (Signed)
Patient wants to know if you can do EGD at time of TCS, he dos have Barrett's and he chews tobacco -- last EGD 2015 -- please advise if ok to add EGD to TCS

## 2016-01-21 ENCOUNTER — Other Ambulatory Visit (INDEPENDENT_AMBULATORY_CARE_PROVIDER_SITE_OTHER): Payer: Self-pay | Admitting: *Deleted

## 2016-01-21 DIAGNOSIS — Z86010 Personal history of colonic polyps: Secondary | ICD-10-CM | POA: Insufficient documentation

## 2016-01-21 DIAGNOSIS — Z800 Family history of malignant neoplasm of digestive organs: Secondary | ICD-10-CM | POA: Insufficient documentation

## 2016-01-21 DIAGNOSIS — K2270 Barrett's esophagus without dysplasia: Secondary | ICD-10-CM | POA: Insufficient documentation

## 2016-01-21 NOTE — Telephone Encounter (Signed)
Patient aware.

## 2016-01-21 NOTE — Telephone Encounter (Signed)
OK to schedule EGD at rhe time of Colonoscopy given history of Barrett's esophagus and continued tobacco use.

## 2016-01-29 ENCOUNTER — Telehealth (INDEPENDENT_AMBULATORY_CARE_PROVIDER_SITE_OTHER): Payer: Self-pay | Admitting: *Deleted

## 2016-01-29 NOTE — Telephone Encounter (Signed)
agree

## 2016-01-29 NOTE — Telephone Encounter (Signed)
Referring MD/PCP: fagan   Procedure: tcs/egd  Reason/Indication:  famhx colon ca, hx polyps, Barrett's esophagus  Has patient had this procedure before?  Yes, 2012  If so, when, by whom and where?    Is there a family history of colon cancer?  Yes, brother  Who?  What age when diagnosed?    Is patient diabetic?   no      Does patient have prosthetic heart valve or mechanical valve?  no  Do you have a pacemaker?  no  Has patient ever had endocarditis? no  Has patient had joint replacement within last 12 months?  no  Does patient tend to be constipated or take laxatives? no  Does patient have a history of alcohol/drug use?  no  Is patient on Coumadin, Plavix and/or Aspirin? no  Medications: pantoprazole 40 mg daily, vit b, vit d  Allergies: pcn, asa  Medication Adjustment:   Procedure date & time: 02/27/16 at 730

## 2016-02-26 ENCOUNTER — Other Ambulatory Visit (INDEPENDENT_AMBULATORY_CARE_PROVIDER_SITE_OTHER): Payer: Self-pay | Admitting: *Deleted

## 2016-02-26 DIAGNOSIS — K2270 Barrett's esophagus without dysplasia: Secondary | ICD-10-CM

## 2016-02-26 DIAGNOSIS — Z800 Family history of malignant neoplasm of digestive organs: Secondary | ICD-10-CM

## 2016-02-26 DIAGNOSIS — Z86010 Personal history of colonic polyps: Secondary | ICD-10-CM

## 2016-02-27 ENCOUNTER — Encounter (HOSPITAL_COMMUNITY)
Admission: RE | Disposition: A | Discharge status: Home / Self Care | Payer: Self-pay | Source: Ambulatory Visit | Attending: Internal Medicine

## 2016-02-27 ENCOUNTER — Encounter (HOSPITAL_COMMUNITY): Payer: Self-pay | Admitting: *Deleted

## 2016-02-27 ENCOUNTER — Ambulatory Visit (HOSPITAL_COMMUNITY)
Admission: RE | Admit: 2016-02-27 | Discharge: 2016-02-27 | Disposition: A | Discharge status: Home / Self Care | Payer: Medicare Other | Source: Ambulatory Visit | Attending: Internal Medicine | Admitting: Internal Medicine

## 2016-02-27 DIAGNOSIS — Z955 Presence of coronary angioplasty implant and graft: Secondary | ICD-10-CM | POA: Diagnosis not present

## 2016-02-27 DIAGNOSIS — Z87442 Personal history of urinary calculi: Secondary | ICD-10-CM | POA: Insufficient documentation

## 2016-02-27 DIAGNOSIS — Z720 Tobacco use: Secondary | ICD-10-CM | POA: Insufficient documentation

## 2016-02-27 DIAGNOSIS — K5730 Diverticulosis of large intestine without perforation or abscess without bleeding: Secondary | ICD-10-CM | POA: Insufficient documentation

## 2016-02-27 DIAGNOSIS — Z09 Encounter for follow-up examination after completed treatment for conditions other than malignant neoplasm: Secondary | ICD-10-CM | POA: Diagnosis not present

## 2016-02-27 DIAGNOSIS — Z880 Allergy status to penicillin: Secondary | ICD-10-CM | POA: Diagnosis not present

## 2016-02-27 DIAGNOSIS — Z86010 Personal history of colonic polyps: Secondary | ICD-10-CM | POA: Diagnosis not present

## 2016-02-27 DIAGNOSIS — K219 Gastro-esophageal reflux disease without esophagitis: Secondary | ICD-10-CM | POA: Diagnosis not present

## 2016-02-27 DIAGNOSIS — Z79899 Other long term (current) drug therapy: Secondary | ICD-10-CM | POA: Insufficient documentation

## 2016-02-27 DIAGNOSIS — M1990 Unspecified osteoarthritis, unspecified site: Secondary | ICD-10-CM | POA: Diagnosis not present

## 2016-02-27 DIAGNOSIS — Z1211 Encounter for screening for malignant neoplasm of colon: Secondary | ICD-10-CM | POA: Diagnosis not present

## 2016-02-27 DIAGNOSIS — Z800 Family history of malignant neoplasm of digestive organs: Secondary | ICD-10-CM | POA: Diagnosis not present

## 2016-02-27 DIAGNOSIS — Z886 Allergy status to analgesic agent status: Secondary | ICD-10-CM | POA: Diagnosis not present

## 2016-02-27 DIAGNOSIS — K644 Residual hemorrhoidal skin tags: Secondary | ICD-10-CM | POA: Insufficient documentation

## 2016-02-27 DIAGNOSIS — Z885 Allergy status to narcotic agent status: Secondary | ICD-10-CM | POA: Diagnosis not present

## 2016-02-27 DIAGNOSIS — K2270 Barrett's esophagus without dysplasia: Secondary | ICD-10-CM

## 2016-02-27 DIAGNOSIS — K228 Other specified diseases of esophagus: Secondary | ICD-10-CM | POA: Diagnosis not present

## 2016-02-27 HISTORY — DX: Unspecified osteoarthritis, unspecified site: M19.90

## 2016-02-27 HISTORY — DX: Personal history of urinary calculi: Z87.442

## 2016-02-27 HISTORY — PX: ESOPHAGOGASTRODUODENOSCOPY: SHX5428

## 2016-02-27 HISTORY — DX: Gastro-esophageal reflux disease without esophagitis: K21.9

## 2016-02-27 HISTORY — PX: COLONOSCOPY: SHX5424

## 2016-02-27 SURGERY — COLONOSCOPY
Anesthesia: Moderate Sedation

## 2016-02-27 MED ORDER — SODIUM CHLORIDE 0.9 % IV SOLN
INTRAVENOUS | Status: DC
  Administered 2016-02-27: 07:00:00 via INTRAVENOUS

## 2016-02-27 MED ORDER — SIMETHICONE 40 MG/0.6ML PO SUSP
ORAL | Status: DC | PRN
  Administered 2016-02-27: 2.5 mL

## 2016-02-27 MED ORDER — MIDAZOLAM HCL 5 MG/5ML IJ SOLN
INTRAMUSCULAR | Status: DC | PRN
  Administered 2016-02-27 (×2): 2 mg via INTRAVENOUS

## 2016-02-27 MED ORDER — MEPERIDINE HCL 50 MG/ML IJ SOLN
INTRAMUSCULAR | Status: DC | PRN
  Administered 2016-02-27: 25 mg via INTRAVENOUS

## 2016-02-27 MED ORDER — BUTAMBEN-TETRACAINE-BENZOCAINE 2-2-14 % EX AERO
INHALATION_SPRAY | CUTANEOUS | Status: DC | PRN
  Administered 2016-02-27: 2 via TOPICAL

## 2016-02-27 MED ORDER — MIDAZOLAM HCL 5 MG/5ML IJ SOLN
INTRAMUSCULAR | Status: AC
  Filled 2016-02-27: qty 10

## 2016-02-27 MED ORDER — MEPERIDINE HCL 50 MG/ML IJ SOLN
INTRAMUSCULAR | Status: AC
  Filled 2016-02-27: qty 1

## 2016-02-27 NOTE — Op Note (Signed)
[facility] Patient Name: Corey Zamora Procedure Date: 02/27/2016 8:02 AM MRN: [id number] Date of Birth: 06-Apr-1940 Attending MD: Hildred Laser , MD CSN: [id number] Age: 75 Admit Type: Outpatient Procedure:                Colonoscopy Indications:              High risk colon cancer surveillance: Personal                            history of colonic polyps, Family history of colon                            cancer in a first-degree relative Providers:                Hildred Laser, MD, Lurline Del, RN, [facility],                            Technician Referring MD:             Asencion Noble, MD Medicines:                Midazolam 1 mg IV Complications:            No immediate complications. Estimated Blood Loss:     Estimated blood loss: none. Procedure:                Pre-Anesthesia Assessment:                           - Prior to the procedure, a History and Physical                            was performed, and patient medications and                            allergies were reviewed. The patient's tolerance of                            previous anesthesia was also reviewed. The risks                            and benefits of the procedure and the sedation                            options and risks were discussed with the patient.                            All questions were answered, and informed consent                            was obtained. Prior Anticoagulants: The patient has                            taken no previous anticoagulant or antiplatelet  agents. ASA Grade Assessment: I - A normal, healthy                            patient. After reviewing the risks and benefits,                            the patient was deemed in satisfactory condition to                            undergo the procedure.                           After obtaining informed consent, the colonoscope                            was passed under direct vision.  Throughout the                            procedure, the patient's blood pressure, pulse, and                            oxygen saturations were monitored continuously. The                            [id number] scope was introduced through                            the anus and advanced to the the cecum, identified                            by appendiceal orifice and ileocecal valve. The                            colonoscopy was performed without difficulty. The                            patient tolerated the procedure well. The quality                            of the bowel preparation was good. The ileocecal                            valve, appendiceal orifice, and rectum were                            photographed. Scope In: 8:05:19 AM Scope Out: 8:26:30 AM Scope Withdrawal Time: 0 hours 7 minutes 10 seconds  Total Procedure Duration: 0 hours 21 minutes 11 seconds  Findings:      The perianal and digital rectal examinations were normal.      Scattered medium-mouthed diverticula were found in the sigmoid colon,       hepatic flexure and ascending colon.      External hemorrhoids were found during retroflexion. The hemorrhoids       were small. Impression:               -  Diverticulosis in the sigmoid colon, at the                            hepatic flexure and in the ascending colon.                           - External hemorrhoids.                           - No specimens collected. Moderate Sedation:      Moderate (conscious) sedation was administered by the endoscopy nurse       and supervised by the endoscopist. The following parameters were       monitored: oxygen saturation, heart rate, blood pressure, CO2       capnography and response to care. Total physician intraservice time was       27 minutes. Recommendation:           - Patient has a contact number available for                            emergencies. The signs and symptoms of potential                             delayed complications were discussed with the                            patient. Return to normal activities tomorrow.                            Written discharge instructions were provided to the                            patient.                           - High fiber diet today.                           - Continue present medications.                           - Await pathology results.                           - May consider colonoscopy in 5 years for                            surveillance. Procedure Code(s):        --- Professional ---                           [phone], Colonoscopy, flexible; diagnostic, including                            collection of specimen(s) by brushing or washing,  when performed (separate procedure)                           99152, Moderate sedation services provided by the                            same physician or other qualified health care                            professional performing the diagnostic or                            therapeutic service that the sedation supports,                            requiring the presence of an independent trained                            observer to assist in the monitoring of the                            patient's level of consciousness and physiological                            status; initial 15 minutes of intraservice time,                            patient age 47 years or older                           [phone], Moderate sedation services; each additional                            15 minutes intraservice time Diagnosis Code(s):        --- Professional ---                           Z86.010, Personal history of colonic polyps                           K64.4, Residual hemorrhoidal skin tags                           Z80.0, Family history of malignant neoplasm of                            digestive organs                           K57.30, Diverticulosis of  large intestine without                            perforation or abscess without bleeding CPT copyright 2016 American Medical Association. All rights reserved. The codes documented in this report are preliminary and upon coder review may  be revised to meet current compliance requirements.  Hildred Laser, MD Hildred Laser, MD 02/27/2016 8:40:31 AM This report has been signed electronically. Number of Addenda: 0

## 2016-02-27 NOTE — Discharge Instructions (Signed)
Resume usual medications and high fiber diet. No driving for 24 hours. Physician will call with biopsy results. May consider colonoscopy in 5 years.   Colonoscopy, Adult, Care After This sheet gives you information about how to care for yourself after your procedure. Your doctor may also give you more specific instructions. If you have problems or questions, call your doctor. Follow these instructions at home: General instructions  For the first 24 hours after the procedure:  Do not drive or use machinery.  Do not sign important documents.  Do not drink alcohol.  Do your daily activities more slowly than normal.  Eat foods that are soft and easy to digest.  Rest often.  Take over-the-counter or prescription medicines only as told by your doctor.  It is up to you to get the results of your procedure. Ask your doctor, or the department performing the procedure, when your results will be ready. To help cramping and bloating:  Try walking around.  Put heat on your belly (abdomen) as told by your doctor. Use a heat source that your doctor recommends, such as a moist heat pack or a heating pad.  Put a towel between your skin and the heat source.  Leave the heat on for 20-30 minutes.  Remove the heat if your skin turns bright red. This is especially important if you cannot feel pain, heat, or cold. You can get burned. Eating and drinking  Drink enough fluid to keep your pee (urine) clear or pale yellow.  Return to your normal diet as told by your doctor. Avoid heavy or fried foods that are hard to digest.  Avoid drinking alcohol for as long as told by your doctor. Contact a doctor if:  You have blood in your poop (stool) 2-3 days after the procedure. Get help right away if:  You have more than a small amount of blood in your poop.  You see large clumps of tissue (blood clots) in your poop.  Your belly is swollen.  You feel sick to your stomach (nauseous).  You  throw up (vomit).  You have a fever.  You have belly pain that gets worse, and medicine does not help your pain. This information is not intended to replace advice given to you by your health care provider. Make sure you discuss any questions you have with your health care provider. Document Released: 04/10/2010 Document Revised: 12/01/2015 Document Reviewed: 12/01/2015 Elsevier Interactive Patient Education  2017 [facility], Care After Refer to this sheet in the next few weeks. These instructions provide you with information about caring for yourself after your procedure. Your health care provider may also give you more specific instructions. Your treatment has been planned according to current medical practices, but problems sometimes occur. Call your health care provider if you have any problems or questions after your procedure. What can I expect after the procedure? After the procedure, it is common to have:  A sore throat.  Bloating.  Nausea. Follow these instructions at home:  Follow instructions from your health care provider about what to eat or drink after your procedure.  Return to your normal activities as told by your health care provider. Ask your health care provider what activities are safe for you.  Take over-the-counter and prescription medicines only as told by your health care provider.  Do not drive for 24 hours if you received a sedative.  Keep all follow-up visits as told by your health care provider. This  is important. Contact a health care provider if:  You have a sore throat that lasts longer than one day.  You have trouble swallowing. Get help right away if:  You have a fever.  You vomit blood or your vomit looks like coffee grounds.  You have bloody, black, or tarry stools.  You have a severe sore throat or you cannot swallow.  You have difficulty breathing.  You have severe pain in your chest or belly. This  information is not intended to replace advice given to you by your health care provider. Make sure you discuss any questions you have with your health care provider. Document Released: 09/07/2011 Document Revised: 08/14/2015 Document Reviewed: 12/19/2014 Elsevier Interactive Patient Education  2017 Elsevier Inc.   Diverticulosis Diverticulosis is the condition that develops when small pouches (diverticula) form in the wall of your colon. Your colon, or large intestine, is where water is absorbed and stool is formed. The pouches form when the inside layer of your colon pushes through weak spots in the outer layers of your colon. CAUSES  No one knows exactly what causes diverticulosis. RISK FACTORS  Being older than 70. Your risk for this condition increases with age. Diverticulosis is rare in people younger than 40 years. By age 34, almost everyone has it.  Eating a low-fiber diet.  Being frequently constipated.  Being overweight.  Not getting enough exercise.  Smoking.  Taking over-the-counter pain medicines, like aspirin and ibuprofen. SYMPTOMS  Most people with diverticulosis do not have symptoms. DIAGNOSIS  Because diverticulosis often has no symptoms, health care providers often discover the condition during an exam for other colon problems. In many cases, a health care provider will diagnose diverticulosis while using a flexible scope to examine the colon (colonoscopy). TREATMENT  If you have never developed an infection related to diverticulosis, you may not need treatment. If you have had an infection before, treatment may include:  Eating more fruits, vegetables, and grains.  Taking a fiber supplement.  Taking a live bacteria supplement (probiotic).  Taking medicine to relax your colon. HOME CARE INSTRUCTIONS   Drink at least 6-8 glasses of water each day to prevent constipation.  Try not to strain when you have a bowel movement.  Keep all follow-up  appointments. If you have had an infection before:  Increase the fiber in your diet as directed by your health care provider or dietitian.  Take a dietary fiber supplement if your health care provider approves.  Only take medicines as directed by your health care provider. SEEK MEDICAL CARE IF:   You have abdominal pain.  You have bloating.  You have cramps.  You have not gone to the bathroom in 3 days. SEEK IMMEDIATE MEDICAL CARE IF:   Your pain gets worse.  Yourbloating becomes very bad.  You have a fever or chills, and your symptoms suddenly get worse.  You begin vomiting.  You have bowel movements that are bloody or black. MAKE SURE YOU:  Understand these instructions.  Will watch your condition.  Will get help right away if you are not doing well or get worse. This information is not intended to replace advice given to you by your health care provider. Make sure you discuss any questions you have with your health care provider. Document Released: 12/04/2003 Document Revised: 03/13/2013 Document Reviewed: 01/31/2013 Elsevier Interactive Patient Education  2017 Elsevier Inc.    Hemorrhoids Hemorrhoids are swollen veins in and around the rectum or anus. There are two types of  hemorrhoids:  Internal hemorrhoids. These occur in the veins that are just inside the rectum. They may poke through to the outside and become irritated and painful.  External hemorrhoids. These occur in the veins that are outside of the anus and can be felt as a painful swelling or hard lump near the anus. Most hemorrhoids do not cause serious problems, and they can be managed with home treatments such as diet and lifestyle changes. If home treatments do not help your symptoms, procedures can be done to shrink or remove the hemorrhoids. What are the causes? This condition is caused by increased pressure in the anal area. This pressure may result from various things,  including:  Constipation.  Straining to have a bowel movement.  Diarrhea.  Pregnancy.  Obesity.  Sitting for long periods of time.  Heavy lifting or other activity that causes you to strain.  Anal sex. What are the signs or symptoms? Symptoms of this condition include:  Pain.  Anal itching or irritation.  Rectal bleeding.  Leakage of stool (feces).  Anal swelling.  One or more lumps around the anus. How is this diagnosed? This condition can often be diagnosed through a visual exam. Other exams or tests may also be done, such as:  Examination of the rectal area with a gloved hand (digital rectal exam).  Examination of the anal canal using a small tube (anoscope).  A blood test, if you have lost a significant amount of blood.  A test to look inside the colon (sigmoidoscopy or colonoscopy). How is this treated? This condition can usually be treated at home. However, various procedures may be done if dietary changes, lifestyle changes, and other home treatments do not help your symptoms. These procedures can help make the hemorrhoids smaller or remove them completely. Some of these procedures involve surgery, and others do not. Common procedures include:  Rubber band ligation. Rubber bands are placed at the base of the hemorrhoids to cut off the blood supply to them.  Sclerotherapy. Medicine is injected into the hemorrhoids to shrink them.  Infrared coagulation. A type of light energy is used to get rid of the hemorrhoids.  Hemorrhoidectomy surgery. The hemorrhoids are surgically removed, and the veins that supply them are tied off.  Stapled hemorrhoidopexy surgery. A circular stapling device is used to remove the hemorrhoids and use staples to cut off the blood supply to them. Follow these instructions at home: Eating and drinking  Eat foods that have a lot of fiber in them, such as whole grains, beans, nuts, fruits, and vegetables. Ask your health care provider  about taking products that have added fiber (fiber supplements).  Drink enough fluid to keep your urine clear or pale yellow. Managing pain and swelling  Take warm sitz baths for 20 minutes, 3-4 times a day to ease pain and discomfort.  If directed, apply ice to the affected area. Using ice packs between sitz baths may be helpful.  Put ice in a plastic bag.  Place a towel between your skin and the bag.  Leave the ice on for 20 minutes, 2-3 times a day. General instructions  Take over-the-counter and prescription medicines only as told by your health care provider.  Use medicated creams or suppositories as told.  Exercise regularly.  Go to the bathroom when you have the urge to have a bowel movement. Do not wait.  Avoid straining to have bowel movements.  Keep the anal area dry and clean. Use wet toilet paper or moist  towelettes after a bowel movement.  Do not sit on the toilet for long periods of time. This increases blood pooling and pain. Contact a health care provider if:  You have increasing pain and swelling that are not controlled by treatment or medicine.  You have uncontrolled bleeding.  You have difficulty having a bowel movement, or you are unable to have a bowel movement.  You have pain or inflammation outside the area of the hemorrhoids. This information is not intended to replace advice given to you by your health care provider. Make sure you discuss any questions you have with your health care provider. Document Released: 03/05/2000 Document Revised: 08/06/2015 Document Reviewed: 11/20/2014 Elsevier Interactive Patient Education  2017 [employer].

## 2016-02-27 NOTE — H&P (Addendum)
Corey Zamora is an 75 y.o. male.   Chief Complaint: Patient is here for EGD and colonoscopy. HPI: Patient is 75 year old Caucasian male with history of short segment Barrett's esophagus history of colonic adenomas and family history of CRC and brother who is here for surveillance EGD and colonoscopy. He states he is taking PPI on as-needed basis. He denies dysphagia nausea vomiting and has infrequent heartburn. He also denies abdominal pain change in bowel habits or rectal bleeding. Last EGD and colonoscopy were in November 2012. Biopsy from distal esophagus revealed inflamed mucosa and Barrett's esophagus without dysplasia. Polyp was a tubular adenoma. His brother had colon carcinoma possibly the 63s and now is 76 years old.  Past Medical History:  Diagnosis Date  . Arthritis   . GERD (gastroesophageal reflux disease)   . History of kidney stones   . Kidney stones     Past Surgical History:  Procedure Laterality Date  . APPENDECTOMY    . COLONOSCOPY    . CYSTOSCOPY WITH STENT PLACEMENT Left 08/04/2012   Procedure: CYSTOSCOPY WITH STENT PLACEMENT;  Surgeon: Hanley Ben, MD;  Location: [place];  Service: Urology;  Laterality: Left;  . CYSTOSCOPY/RETROGRADE/URETEROSCOPY/STONE EXTRACTION WITH BASKET Left 08/04/2012   Procedure: CYSTOSCOPY/RETROGRADE/URETEROSCOPY/STONE EXTRACTION WITH BASKET;  Surgeon: Hanley Ben, MD;  Location: [facility];  Service: Urology;  Laterality: Left;  . ESOPHAGOGASTRODUODENOSCOPY (EGD) WITH ESOPHAGEAL DILATION N/A 04/19/2013   Procedure: ESOPHAGOGASTRODUODENOSCOPY (EGD) WITH ESOPHAGEAL DILATION;  Surgeon: Rogene Houston, MD;  [facility];  Service: Endoscopy;  Laterality: N/A;  200-moved to [place] notified pt  . HOLMIUM LASER APPLICATION Left 0000000   Procedure: HOLMIUM LASER APPLICATION;  Surgeon: Hanley Ben, MD;  Location: [facility];  Service: Urology;  Laterality: Left;  . Right  hand surgery      Family History  Problem Relation Age of Onset  . Colon cancer Brother    Social History:  reports that he has never smoked. His smokeless tobacco use includes Chew. He reports that he does not drink alcohol or use drugs.  Allergies:  Allergies  Allergen Reactions  . Aspirin Hives  . Codeine Nausea And Vomiting  . Penicillins Hives    Has patient had a PCN reaction causing immediate rash, facial/tongue/throat swelling, SOB or lightheadedness with hypotension: no Has patient had a PCN reaction causing severe rash involving mucus membranes or skin necrosis: no Has patient had a PCN reaction that required hospitalization no Has patient had a PCN reaction occurring within the last 10 years: no If all of the above answers are "NO", then may proceed with Cephalosporin use.    Medications Prior to Admission  Medication Sig Dispense Refill  . Ascorbic Acid (VITAMIN C) 1000 MG tablet Take 1,000 mg by mouth daily.    . cholecalciferol (VITAMIN D) 1000 units tablet Take 1,000 Units by mouth daily.    . pantoprazole (PROTONIX) 40 MG tablet TAKE ONE TABLET BY MOUTH ONCE DAILY 30  MINUTES  BEFORE  BREAKFAST 90 tablet 0  . vitamin B-12 (CYANOCOBALAMIN) 1000 MCG tablet Take 1,000 mcg by mouth daily.    . vitamin E 400 UNIT capsule Take 400 Units by mouth daily.      No results found for this or any previous visit (from the past 48 hour(s)). No results found.  ROS  Blood pressure 132/86, pulse 64, temperature 97.7 F (36.5 C), temperature source Oral, resp. rate 10, SpO2 98 %. Physical Exam   Assessment/Plan Barrett's  esophagus. History of colonic adenoma and family history of CRC in brother. Surveillance EGD and colonoscopy.  Hildred Laser, MD 02/27/2016, 7:36 AM

## 2016-02-27 NOTE — Op Note (Signed)
[facility] Patient Name: Corey Zamora Procedure Date: 02/27/2016 7:21 AM MRN: [id number] Date of Birth: February 09, 1941 Attending MD: Hildred Laser , MD CSN: [id number] Age: 75 Admit Type: Outpatient Procedure:                Upper GI endoscopy Indications:              Follow-up of Barrett's esophagus Providers:                Hildred Laser, MD, Lurline Del, RN, Purcell Nails. [place],                            [employer]chant [employer] officer Referring MD:             Asencion Noble, MD Medicines:                Cetacaine spray, Meperidine 25 mg IV, Midazolam 3                            mg IV Complications:            No immediate complications. Estimated Blood Loss:     Estimated blood loss was minimal. Procedure:                Pre-Anesthesia Assessment:                           - Prior to the procedure, a History and Physical                            was performed, and patient medications and                            allergies were reviewed. The patient's tolerance of                            previous anesthesia was also reviewed. The risks                            and benefits of the procedure and the sedation                            options and risks were discussed with the patient.                            All questions were answered, and informed consent                            was obtained. Prior Anticoagulants: The patient has                            taken no previous anticoagulant or antiplatelet                            agents. ASA Grade Assessment: I - A normal, healthy  patient. After reviewing the risks and benefits,                            the patient was deemed in satisfactory condition to                            undergo the procedure.                           After obtaining informed consent, the endoscope was                            passed under direct vision. Throughout the                            procedure, the patient's blood  pressure, pulse, and                            oxygen saturations were monitored continuously. The                            [id number]) scope was introduced through the                            mouth, and advanced to the second part of duodenum.                            The upper GI endoscopy was accomplished without                            difficulty. The patient tolerated the procedure                            well. Scope In: 7:51:51 AM Scope Out: 7:59:42 AM Total Procedure Duration: 0 hours 7 minutes 51 seconds  Findings:      The upper third of the esophagus and middle third of the esophagus were       normal.      There were esophageal mucosal changes secondary to established       short-segment Barrett's disease present in the distal esophagus. The       maximum longitudinal extent of these mucosal changes was 10 mm in       length. There were single island. Mucosa was biopsied with a cold       forceps for histology. A total of 4 specimen bottles were sent to       pathology.      The Z-line was irregular and was found 40 cm from the incisors.      The entire examined stomach was normal.      The duodenal bulb and second portion of the duodenum were normal. Impression:               - Normal upper third of esophagus and middle third                            of esophagus.                           -  Esophageal mucosal changes secondary to                            established short-segment Barrett's disease.                            Biopsied.                           - Z-line irregular, 40 cm from the incisors.                           - Normal stomach.                           - Normal duodenal bulb and second portion of the                            duodenum. Moderate Sedation:      Moderate (conscious) sedation was administered by the endoscopy nurse       and supervised by the endoscopist. The following parameters were       monitored: oxygen  saturation, heart rate, blood pressure, CO2       capnography and response to care. Total physician intraservice time was       16 minutes. Recommendation:           - Patient has a contact number available for                            emergencies. The signs and symptoms of potential                            delayed complications were discussed with the                            patient. Return to normal activities tomorrow.                            Written discharge instructions were provided to the                            patient.                           - High fiber diet today.                           - Continue present medications.                           - Await pathology results.                           - No repeat upper endoscopy unless patient has                            symptoms or current biopsy reveals dysplasia. Procedure  Code(s):        --- Professional ---                           [phone], Esophagogastroduodenoscopy, flexible,                            transoral; with biopsy, single or multiple                           99152, Moderate sedation services provided by the                            same physician or other qualified health care                            professional performing the diagnostic or                            therapeutic service that the sedation supports,                            requiring the presence of an independent trained                            observer to assist in the monitoring of the                            patient's level of consciousness and physiological                            status; initial 15 minutes of intraservice time,                            patient age 96 years or older Diagnosis Code(s):        --- Professional ---                           K22.70, Barrett's esophagus without dysplasia                           K22.8, Other specified diseases of esophagus CPT copyright 2016 American Medical  Association. All rights reserved. The codes documented in this report are preliminary and upon coder review may  be revised to meet current compliance requirements. Hildred Laser, MD Hildred Laser, MD 02/27/2016 8:36:09 AM This report has been signed electronically. Number of Addenda: 0

## 2016-03-08 ENCOUNTER — Encounter (HOSPITAL_COMMUNITY): Payer: Self-pay | Admitting: Internal Medicine

## 2016-04-30 DIAGNOSIS — K2270 Barrett's esophagus without dysplasia: Secondary | ICD-10-CM | POA: Diagnosis not present

## 2016-04-30 DIAGNOSIS — Z79899 Other long term (current) drug therapy: Secondary | ICD-10-CM | POA: Diagnosis not present

## 2016-04-30 DIAGNOSIS — Z125 Encounter for screening for malignant neoplasm of prostate: Secondary | ICD-10-CM | POA: Diagnosis not present

## 2016-04-30 DIAGNOSIS — I4510 Unspecified right bundle-branch block: Secondary | ICD-10-CM | POA: Diagnosis not present

## 2016-05-25 DIAGNOSIS — E785 Hyperlipidemia, unspecified: Secondary | ICD-10-CM | POA: Diagnosis not present

## 2016-05-25 DIAGNOSIS — K2270 Barrett's esophagus without dysplasia: Secondary | ICD-10-CM | POA: Diagnosis not present

## 2016-05-25 DIAGNOSIS — Z0001 Encounter for general adult medical examination with abnormal findings: Secondary | ICD-10-CM | POA: Diagnosis not present

## 2016-05-25 DIAGNOSIS — K219 Gastro-esophageal reflux disease without esophagitis: Secondary | ICD-10-CM | POA: Diagnosis not present

## 2016-05-25 DIAGNOSIS — I4510 Unspecified right bundle-branch block: Secondary | ICD-10-CM | POA: Diagnosis not present

## 2016-06-15 DIAGNOSIS — H6502 Acute serous otitis media, left ear: Secondary | ICD-10-CM | POA: Diagnosis not present

## 2016-06-15 DIAGNOSIS — H903 Sensorineural hearing loss, bilateral: Secondary | ICD-10-CM | POA: Diagnosis not present

## 2016-07-05 ENCOUNTER — Other Ambulatory Visit (INDEPENDENT_AMBULATORY_CARE_PROVIDER_SITE_OTHER): Payer: Self-pay | Admitting: Internal Medicine

## 2016-07-06 DIAGNOSIS — H6502 Acute serous otitis media, left ear: Secondary | ICD-10-CM | POA: Diagnosis not present

## 2016-12-28 DIAGNOSIS — Z23 Encounter for immunization: Secondary | ICD-10-CM | POA: Diagnosis not present

## 2017-02-07 ENCOUNTER — Encounter (INDEPENDENT_AMBULATORY_CARE_PROVIDER_SITE_OTHER): Payer: Self-pay | Admitting: Internal Medicine

## 2017-02-07 DIAGNOSIS — H524 Presbyopia: Secondary | ICD-10-CM | POA: Diagnosis not present

## 2017-02-23 ENCOUNTER — Encounter (INDEPENDENT_AMBULATORY_CARE_PROVIDER_SITE_OTHER): Payer: Self-pay | Admitting: Internal Medicine

## 2017-02-23 ENCOUNTER — Ambulatory Visit (INDEPENDENT_AMBULATORY_CARE_PROVIDER_SITE_OTHER): Payer: Medicare Other | Admitting: Internal Medicine

## 2017-02-23 VITALS — BP 152/90 | HR 60 | Temp 98.0°F | Ht 66.0 in | Wt 178.8 lb

## 2017-02-23 DIAGNOSIS — K2270 Barrett's esophagus without dysplasia: Secondary | ICD-10-CM

## 2017-02-23 DIAGNOSIS — K219 Gastro-esophageal reflux disease without esophagitis: Secondary | ICD-10-CM

## 2017-02-23 NOTE — Progress Notes (Addendum)
Subjective:    Patient ID: Corey Zamora, male    DOB: 21-Apr-1940, 75 y.o.   MRN: [id number]  HPI Here today for f/u. Hx of Barrett's and colon polyps.  He tells me he is doing good. Acid reflux controlled with Protonix. Appetite is good. He has gained about 10 pounds. BMs are normal. Has one a day. No melena or BRRB. Visits his wife [facility] at the [facility]. Wife has dementia.   Underwent a colonoscopy in December of last year for hx of colon polyps.    Impression: - Diverticulosis in the sigmoid colon, at the                            hepatic flexure and in the ascending colon.                           - External hemorrhoids.                           - No specimens collected.    02/27/2016 EGD Follow up of Barrett's   Impression:               - Normal upper third of esophagus and middle third                            of esophagus.                           - Esophageal mucosal changes secondary to                            established short-segment Barrett's disease.                            Biopsied.                           - Z-line irregular, 40 cm from the incisors.                           - Normal stomach.                           - Normal duodenal bulb and second portion of the                            duodenum.   Review of Systems  Past Medical History:  Diagnosis Date  . Arthritis   . GERD (gastroesophageal reflux disease)   . History of kidney stones   . Kidney stones     Past Surgical History:  Procedure Laterality Date  . APPENDECTOMY    . COLONOSCOPY    . COLONOSCOPY N/A 02/27/2016   Procedure: COLONOSCOPY;  Surgeon: Rogene Houston, MD;  [facility];  Service: Endoscopy;  Laterality: N/A;  730  . CYSTOSCOPY WITH STENT PLACEMENT Left 08/04/2012   Procedure: CYSTOSCOPY WITH STENT PLACEMENT;  Surgeon: Hanley Ben, MD;  Location: [facility];  Service: Urology;  Laterality: Left;  .  CYSTOSCOPY/RETROGRADE/URETEROSCOPY/STONE EXTRACTION WITH  BASKET Left 08/04/2012   Procedure: CYSTOSCOPY/RETROGRADE/URETEROSCOPY/STONE EXTRACTION WITH BASKET;  Surgeon: Hanley Ben, MD;  Location: [facility];  Service: Urology;  Laterality: Left;  . ESOPHAGOGASTRODUODENOSCOPY N/A 02/27/2016   Procedure: ESOPHAGOGASTRODUODENOSCOPY (EGD);  Surgeon: Rogene Houston, MD;  [facility];  Service: Endoscopy;  Laterality: N/A;  . ESOPHAGOGASTRODUODENOSCOPY (EGD) WITH ESOPHAGEAL DILATION N/A 04/19/2013   Procedure: ESOPHAGOGASTRODUODENOSCOPY (EGD) WITH ESOPHAGEAL DILATION;  Surgeon: Rogene Houston, MD;  [facility];  Service: Endoscopy;  Laterality: N/A;  200-moved to [place] notified pt  . HOLMIUM LASER APPLICATION Left 09/23/8887   Procedure: HOLMIUM LASER APPLICATION;  Surgeon: Hanley Ben, MD;  Location: [facility];  Service: Urology;  Laterality: Left;  . Right hand surgery      Allergies  Allergen Reactions  . Aspirin Hives  . Codeine Nausea And Vomiting  . Penicillins Hives    Has patient had a PCN reaction causing immediate rash, facial/tongue/throat swelling, SOB or lightheadedness with hypotension: no Has patient had a PCN reaction causing severe rash involving mucus membranes or skin necrosis: no Has patient had a PCN reaction that required hospitalization no Has patient had a PCN reaction occurring within the last 10 years: no If all of the above answers are "NO", then may proceed with Cephalosporin use.    Current Outpatient Medications on File Prior to Visit  Medication Sig Dispense Refill  . cholecalciferol (VITAMIN D) 1000 units tablet Take 500 Units by mouth daily.     . pantoprazole (PROTONIX) 40 MG tablet TAKE ONE TABLET BY MOUTH ONCE DAILY 30 MINUTES BEFORE BREAKFAST 90 tablet 3  . vitamin B-12 (CYANOCOBALAMIN) 1000 MCG tablet Take 1,000 mcg by mouth daily.     No current facility-administered medications on file  prior to visit.         Objective:   Physical Exam Blood pressure (!) 152/90, pulse 60, temperature 98 F (36.7 C), height 5\' 6"  (1.676 m), weight 178 lb 12.8 oz (81.1 kg). Alert and oriented. Skin warm and dry. Oral mucosa is moist.   . Sclera anicteric, conjunctivae is pink. Thyroid not enlarged. No cervical lymphadenopathy. Lungs clear. Heart regular rate and rhythm.  Abdomen is soft. Bowel sounds are positive. No hepatomegaly. No abdominal masses felt. No tenderness.  No edema to lower extremities.           Assessment & Plan:  Barrett's: He is doing well. No GI complaints. GERD controlled with Protonix.  OV in one year.

## 2017-02-23 NOTE — Patient Instructions (Addendum)
OV in 1 year. Continue the Protonix 

## 2017-02-28 ENCOUNTER — Ambulatory Visit (INDEPENDENT_AMBULATORY_CARE_PROVIDER_SITE_OTHER): Payer: Medicare Other | Admitting: Internal Medicine

## 2017-06-07 DIAGNOSIS — Z79899 Other long term (current) drug therapy: Secondary | ICD-10-CM | POA: Diagnosis not present

## 2017-06-07 DIAGNOSIS — I4510 Unspecified right bundle-branch block: Secondary | ICD-10-CM | POA: Diagnosis not present

## 2017-06-07 DIAGNOSIS — E785 Hyperlipidemia, unspecified: Secondary | ICD-10-CM | POA: Diagnosis not present

## 2017-06-07 DIAGNOSIS — K219 Gastro-esophageal reflux disease without esophagitis: Secondary | ICD-10-CM | POA: Diagnosis not present

## 2017-06-14 DIAGNOSIS — E786 Lipoprotein deficiency: Secondary | ICD-10-CM | POA: Diagnosis not present

## 2017-06-14 DIAGNOSIS — K2270 Barrett's esophagus without dysplasia: Secondary | ICD-10-CM | POA: Diagnosis not present

## 2017-06-14 DIAGNOSIS — Z0001 Encounter for general adult medical examination with abnormal findings: Secondary | ICD-10-CM | POA: Diagnosis not present

## 2017-06-14 DIAGNOSIS — N189 Chronic kidney disease, unspecified: Secondary | ICD-10-CM | POA: Diagnosis not present

## 2017-06-14 DIAGNOSIS — I4510 Unspecified right bundle-branch block: Secondary | ICD-10-CM | POA: Diagnosis not present

## 2017-07-15 DIAGNOSIS — N181 Chronic kidney disease, stage 1: Secondary | ICD-10-CM | POA: Diagnosis not present

## 2017-07-15 DIAGNOSIS — Z79899 Other long term (current) drug therapy: Secondary | ICD-10-CM | POA: Diagnosis not present

## 2017-07-19 ENCOUNTER — Other Ambulatory Visit (HOSPITAL_COMMUNITY): Payer: Self-pay | Admitting: Internal Medicine

## 2017-07-19 DIAGNOSIS — D126 Benign neoplasm of colon, unspecified: Secondary | ICD-10-CM

## 2017-07-22 ENCOUNTER — Ambulatory Visit (HOSPITAL_COMMUNITY)
Admission: RE | Admit: 2017-07-22 | Discharge: 2017-07-22 | Disposition: A | Discharge status: Home / Self Care | Payer: PPO | Source: Ambulatory Visit | Attending: Internal Medicine | Admitting: Internal Medicine

## 2017-07-22 DIAGNOSIS — D126 Benign neoplasm of colon, unspecified: Secondary | ICD-10-CM | POA: Insufficient documentation

## 2017-07-22 DIAGNOSIS — N2889 Other specified disorders of kidney and ureter: Secondary | ICD-10-CM | POA: Diagnosis not present

## 2017-07-22 DIAGNOSIS — N200 Calculus of kidney: Secondary | ICD-10-CM | POA: Insufficient documentation

## 2017-08-04 DIAGNOSIS — N132 Hydronephrosis with renal and ureteral calculous obstruction: Secondary | ICD-10-CM | POA: Diagnosis not present

## 2017-08-04 DIAGNOSIS — N1330 Unspecified hydronephrosis: Secondary | ICD-10-CM | POA: Diagnosis not present

## 2017-08-11 DIAGNOSIS — N1330 Unspecified hydronephrosis: Secondary | ICD-10-CM | POA: Diagnosis not present

## 2017-08-18 DIAGNOSIS — N202 Calculus of kidney with calculus of ureter: Secondary | ICD-10-CM | POA: Diagnosis not present

## 2017-08-18 DIAGNOSIS — N201 Calculus of ureter: Secondary | ICD-10-CM | POA: Diagnosis not present

## 2017-08-30 ENCOUNTER — Other Ambulatory Visit (INDEPENDENT_AMBULATORY_CARE_PROVIDER_SITE_OTHER): Payer: Self-pay | Admitting: Internal Medicine

## 2017-08-31 NOTE — Telephone Encounter (Signed)
[facility] in [place] and a verbal refill was given to Jonelle,pharmacist.

## 2017-08-31 NOTE — Telephone Encounter (Signed)
RX was called to [facility].

## 2017-09-08 DIAGNOSIS — N201 Calculus of ureter: Secondary | ICD-10-CM | POA: Diagnosis not present

## 2017-09-08 DIAGNOSIS — N202 Calculus of kidney with calculus of ureter: Secondary | ICD-10-CM | POA: Diagnosis not present

## 2017-09-19 DIAGNOSIS — N202 Calculus of kidney with calculus of ureter: Secondary | ICD-10-CM | POA: Diagnosis not present

## 2017-10-17 DIAGNOSIS — N201 Calculus of ureter: Secondary | ICD-10-CM | POA: Diagnosis not present

## 2017-10-17 DIAGNOSIS — N182 Chronic kidney disease, stage 2 (mild): Secondary | ICD-10-CM | POA: Diagnosis not present

## 2017-11-29 DIAGNOSIS — N202 Calculus of kidney with calculus of ureter: Secondary | ICD-10-CM | POA: Diagnosis not present

## 2017-12-01 ENCOUNTER — Other Ambulatory Visit: Payer: Self-pay | Admitting: Urology

## 2017-12-06 ENCOUNTER — Encounter (HOSPITAL_BASED_OUTPATIENT_CLINIC_OR_DEPARTMENT_OTHER): Payer: Self-pay | Admitting: *Deleted

## 2017-12-06 ENCOUNTER — Other Ambulatory Visit: Payer: Self-pay

## 2017-12-06 NOTE — Progress Notes (Signed)
Spoke with patient via telephone for pre op history interview. NPO after MN including chewing tobacco. Patient to take protonix with a sip of water AM of surgery. Patient to arrive at 60.

## 2017-12-11 NOTE — H&P (Signed)
HPI: Corey Zamora is a 77 year-old male with bilateral distal ureteral stones.  The patient's stone was on his bilateral side. He first noticed the symptoms 07/22/2017. This is not his first kidney stone. He is not currently having flank pain, back pain, groin pain, nausea, vomiting, fever or chills. He does not have a burning sensation when he urinates. He has not caught a stone in his urine strainer since his symptoms began.   He has had ESWL and Ureteral Stent for treatment of his stones in the past. This condition would be considered of mild to moderate severity with no modifying factors or associated signs or symptoms other than as noted above.   08/04/17: A renal ultrasound on 07/22/17 revealed a right renal cyst, a 4 mm nonobstructing right lower pole stone and what was described as dilatation of the upper pole collecting system and renal pelvis suggesting mild hydronephrosis. Urinalysis in 3/19 was noted to be completely clear.  He has a long history of kidney stones. He said was recently seen and found to have an elevation of his creatinine prompting an ultrasound which revealed hydronephrosis but no definite stone. He has undergone lithotripsy and ureteroscopy in the past. He said overall he has passed about 15 stones in his lifetime. He has not had any hematuria and denies any new voiding symptoms. He does report some discomfort in the lower back on the left-hand side but he said he gets out in does a lot a work so it might be from that.   09/19/17: He returns today for follow-up of bilateral distal ureteral stones that were noted to be nonobstructing. He continues to do well. He is not having any flank pain. He has not seen any stones pass. He denies any hematuria. He was taking tamsulosin but he said he stop taking the medication.   10/17/17: He has returned today for follow-up of bilateral, nonobstructing, ureteral stone measuring 3 mm on both sides. He said he has not seen nor has he heard a stone  pass. The told me he is taking an over-the-counter stone dissolving medication. He has not had any hematuria. He denies any flank pain. He has not had any change in his voiding pattern and continues to void large volumes.   11/29/17: He remains asymptomatic. He remains free of any hematuria. He continues to void normal volume. He has not seen any stones pass.     ALLERGIES: Aspirin Codeine Derivatives Penicillins    MEDICATIONS: No Reported Medications     GU PSH: Cysto Uretero Lithotripsy - 2014 Cystoscopy Insert Stent - 2014      PSH Notes: Cystoscopy With Ureteroscopy With Lithotripsy, Cystoscopy With Insertion Of Ureteral Stent Left, Shoulder Surgery   NON-GU PSH: Appendectomy Shoulder Arthroscopy/surgery    GU PMH: Chronic kidney disease stage 2 (GFR 60-90) (Stable), His creatinine was 1.4 in 5/19. I am going to recheck his creatinine today. - 10/17/2017 Ureteral calculus (Acute), Bilateral, He had a right and left ureteral stone. He continues to urinate without difficulty indicating he is not obstructed. I am going to recheck a creatinine today. Because I cannot definitely tell whether there are stones present due to the number of phleboliths in his pelvis when he returns I am going to image him with a CT scan of the pelvis. - 08/18/2017, Calculus of left ureter, - 2014 Hydronephrosis Unspec (Acute), Left, With a creatinine of 1.34 and no pain I told him he was at little risk any he did not have  any dilation of his ureter so I do not think he is completely obstructed. In addition he is not having any flank pain so what we have discussed doing is proceeding with medical expulsive therapy. I started him on tamsulosin and will see him back for follow-up with a KUB. - 08/04/2017 Renal calculus, Right - 07/22/2017 Renal cyst, Right - 07/22/2017      PMH Notes: History of calculus disease: He had a left distal ureteral stone treated with ureteroscopy and laser lithotripsy in 5/14 by Dr. Janice Norrie.   Stone analysis: Calcium oxalate.     NON-GU PMH: Encounter for general adult medical examination without abnormal findings, Encounter for preventive health examination    FAMILY HISTORY: Family Health Status Number - Runs In Family Father Deceased At Age27 ___ - Runs In Family Mother Deceased At Age 71 from diabetic complicati - Runs In Family nephrolithiasis - Brother Prostate Cancer - Brother Transient Ischemic Attack - Father   SOCIAL HISTORY: Marital Status: Divorced Preferred Language: English; Ethnicity: Not Hispanic Or Latino; Race: White Has never drank.  Drinks 1 caffeinated drink per day.     Notes: Occupation: Retired, Marital History - Divorced, Alcohol Use, Never A Smoker, Caffeine Use   REVIEW OF SYSTEMS:    GU Review Male:   Patient denies frequent urination, hard to postpone urination, burning/ pain with urination, get up at night to urinate, leakage of urine, stream starts and stops, trouble starting your stream, have to strain to urinate , erection problems, and penile pain.  Gastrointestinal (Upper):   Patient denies nausea, vomiting, and indigestion/ heartburn.  Gastrointestinal (Lower):   Patient denies diarrhea and constipation.  Constitutional:   Patient denies fever, night sweats, weight loss, and fatigue.  Skin:   Patient denies skin rash/ lesion and itching.  Eyes:   Patient denies blurred vision and double vision.  Ears/ Nose/ Throat:   Patient denies sore throat and sinus problems.  Hematologic/Lymphatic:   Patient denies swollen glands and easy bruising.  Cardiovascular:   Patient denies leg swelling and chest pains.  Respiratory:   Patient denies cough and shortness of breath.  Endocrine:   Patient denies excessive thirst.  Musculoskeletal:   Patient denies back pain and joint pain.  Neurological:   Patient denies headaches and dizziness.  Psychologic:   Patient denies depression and anxiety.   VITAL SIGNS:    Weight 170 lb / 77.11 kg  Height 66  in / 167.64 cm  BP 142/79 mmHg  Pulse 68 /min  BMI 27.4 kg/m   PHYSICAL EXAMINATION:    Constitutional: Well-nourished. No physical deformities. Normally developed. Good grooming.  Neck: Neck symmetrical, not swollen. Normal tracheal position.  Respiratory: No labored breathing, no use of accessory muscles. Normal breath sounds.  Cardiovascular: Normal temperature, normal extremity pulses, no swelling, no varicosities.   Lymphatic: No enlargement of neck, axillae, groin.  Skin: No paleness, no jaundice, no cyanosis. No lesion, no ulcer, no rash.  Neurologic / Psychiatric: Oriented to time, oriented to place, oriented to person. No depression, no anxiety, no agitation.  Gastrointestinal: No mass, no tenderness, no rigidity, non obese abdomen.  Eyes: Normal conjunctivae. Normal eyelids.  Ears, Nose, Mouth, and Throat: Left ear no scars, no lesions, no masses. Right ear no scars, no lesions, no masses. Nose no scars, no lesions, no masses. Normal hearing. Normal lips.  Musculoskeletal: Normal gait and station of head and neck.    PAST DATA REVIEWED:  Source Of History:  Patient  Lab Test  Review:   BUN/Creatinine  Records Review:   Previous Patient Records, POC Tool  X-Ray Review: C.T. Stone Protocol: Reviewed Films. Previous CT scan images were reviewed and compared with today's study.    04/30/17  PSA  Total PSA 2.4 ng/dl    10/17/17 08/18/17 08/11/17  General Chemistry  Creatinine 1.1 mg/dL 1.4 mg/dL 1.1 mg/dL   PROCEDURES:         C.T. Urogram - [id number]      Review of his CT scan today reveals bilateral distal ureteral stones without hydronephrosis   ASSESSMENT/PLAN:     ICD-10 Details  1 GU:   Ureteral calculus - N20.1 Bilateral, Stable - We are going to proceed with ureteroscopic management of his bilateral distal ureteral stones. Had a bad experience with office cystoscopic removal of his stent in the past so I will leave tethers on the if used.  2   Renal calculus - N20.0  Bilateral, As punctate bilateral renal calculi measure 1-2 mm.               Notes:   He still has distal ureteral stones that are not causing obstruction but we discussed the concept of silent obstruction and the need for them to be treated. I told him that I did not think lithotripsy would be the best means to treat bilateral distal ureteral stones and therefore I have recommended ureteroscopy. We discussed the procedure in detail including its risks and complications, the outpatient nature of the procedure and the probability of success as well as the possible need for 1 or possibly 2 stents after the procedure.

## 2017-12-12 ENCOUNTER — Encounter (HOSPITAL_BASED_OUTPATIENT_CLINIC_OR_DEPARTMENT_OTHER)
Admission: RE | Disposition: A | Discharge status: Home / Self Care | Payer: Self-pay | Source: Ambulatory Visit | Attending: Urology

## 2017-12-12 ENCOUNTER — Ambulatory Visit (HOSPITAL_COMMUNITY): Payer: PPO

## 2017-12-12 ENCOUNTER — Encounter (HOSPITAL_BASED_OUTPATIENT_CLINIC_OR_DEPARTMENT_OTHER): Payer: Self-pay | Admitting: *Deleted

## 2017-12-12 ENCOUNTER — Ambulatory Visit (HOSPITAL_BASED_OUTPATIENT_CLINIC_OR_DEPARTMENT_OTHER)
Admission: RE | Admit: 2017-12-12 | Discharge: 2017-12-12 | Disposition: A | Discharge status: Home / Self Care | Payer: PPO | Source: Ambulatory Visit | Attending: Urology | Admitting: Urology

## 2017-12-12 ENCOUNTER — Ambulatory Visit (HOSPITAL_BASED_OUTPATIENT_CLINIC_OR_DEPARTMENT_OTHER): Payer: PPO | Admitting: Anesthesiology

## 2017-12-12 ENCOUNTER — Other Ambulatory Visit: Payer: Self-pay

## 2017-12-12 DIAGNOSIS — N182 Chronic kidney disease, stage 2 (mild): Secondary | ICD-10-CM | POA: Insufficient documentation

## 2017-12-12 DIAGNOSIS — Z885 Allergy status to narcotic agent status: Secondary | ICD-10-CM | POA: Insufficient documentation

## 2017-12-12 DIAGNOSIS — Z79899 Other long term (current) drug therapy: Secondary | ICD-10-CM | POA: Insufficient documentation

## 2017-12-12 DIAGNOSIS — Z886 Allergy status to analgesic agent status: Secondary | ICD-10-CM | POA: Diagnosis not present

## 2017-12-12 DIAGNOSIS — N202 Calculus of kidney with calculus of ureter: Secondary | ICD-10-CM | POA: Diagnosis not present

## 2017-12-12 DIAGNOSIS — N201 Calculus of ureter: Secondary | ICD-10-CM | POA: Insufficient documentation

## 2017-12-12 DIAGNOSIS — Z87442 Personal history of urinary calculi: Secondary | ICD-10-CM | POA: Insufficient documentation

## 2017-12-12 DIAGNOSIS — Z880 Allergy status to penicillin: Secondary | ICD-10-CM | POA: Insufficient documentation

## 2017-12-12 HISTORY — DX: Cataract extraction status, unspecified eye: Z98.49

## 2017-12-12 HISTORY — PX: URETEROSCOPY WITH HOLMIUM LASER LITHOTRIPSY: SHX6645

## 2017-12-12 SURGERY — URETEROSCOPY, WITH LITHOTRIPSY USING HOLMIUM LASER
Anesthesia: General | Site: Ureter | Laterality: Bilateral

## 2017-12-12 MED ORDER — CIPROFLOXACIN IN D5W 400 MG/200ML IV SOLN
400.0000 mg | Freq: Once | INTRAVENOUS | Status: AC
  Administered 2017-12-12: 400 mg via INTRAVENOUS
  Filled 2017-12-12: qty 200

## 2017-12-12 MED ORDER — PROPOFOL 10 MG/ML IV BOLUS
INTRAVENOUS | Status: DC | PRN
  Administered 2017-12-12: 140 mg via INTRAVENOUS

## 2017-12-12 MED ORDER — HYDROCODONE-ACETAMINOPHEN 10-325 MG PO TABS: 1.0000 | ORAL_TABLET | ORAL | 0 refills | Status: DC | PRN

## 2017-12-12 MED ORDER — DEXAMETHASONE SODIUM PHOSPHATE 10 MG/ML IJ SOLN
INTRAMUSCULAR | Status: AC
  Filled 2017-12-12: qty 1

## 2017-12-12 MED ORDER — LIDOCAINE 2% (20 MG/ML) 5 ML SYRINGE
INTRAMUSCULAR | Status: AC
  Filled 2017-12-12: qty 5

## 2017-12-12 MED ORDER — PHENAZOPYRIDINE HCL 200 MG PO TABS: 200.0000 mg | ORAL_TABLET | Freq: Three times a day (TID) | ORAL | 0 refills | Status: DC | PRN

## 2017-12-12 MED ORDER — PHENAZOPYRIDINE HCL 100 MG PO TABS
ORAL_TABLET | ORAL | Status: AC
  Filled 2017-12-12: qty 1

## 2017-12-12 MED ORDER — FENTANYL CITRATE (PF) 100 MCG/2ML IJ SOLN
INTRAMUSCULAR | Status: DC | PRN
  Administered 2017-12-12 (×3): 25 ug via INTRAVENOUS

## 2017-12-12 MED ORDER — PHENAZOPYRIDINE HCL 200 MG PO TABS
200.0000 mg | ORAL_TABLET | Freq: Three times a day (TID) | ORAL | Status: DC
  Administered 2017-12-12: 200 mg via ORAL
  Filled 2017-12-12: qty 1

## 2017-12-12 MED ORDER — ACETAMINOPHEN 325 MG RE SUPP
RECTAL | Status: AC
  Filled 2017-12-12: qty 1

## 2017-12-12 MED ORDER — ONDANSETRON HCL 4 MG/2ML IJ SOLN
INTRAMUSCULAR | Status: AC
  Filled 2017-12-12: qty 2

## 2017-12-12 MED ORDER — FENTANYL CITRATE (PF) 100 MCG/2ML IJ SOLN
INTRAMUSCULAR | Status: AC
  Filled 2017-12-12: qty 2

## 2017-12-12 MED ORDER — LACTATED RINGERS IV SOLN
INTRAVENOUS | Status: DC
  Administered 2017-12-12 (×2): via INTRAVENOUS
  Filled 2017-12-12: qty 1000

## 2017-12-12 MED ORDER — ONDANSETRON HCL 4 MG/2ML IJ SOLN
INTRAMUSCULAR | Status: DC | PRN
  Administered 2017-12-12: 4 mg via INTRAVENOUS

## 2017-12-12 MED ORDER — PROPOFOL 10 MG/ML IV BOLUS
INTRAVENOUS | Status: AC
  Filled 2017-12-12: qty 40

## 2017-12-12 MED ORDER — SODIUM CHLORIDE 0.9 % IR SOLN
Status: DC | PRN
  Administered 2017-12-12: 1 via INTRAVESICAL

## 2017-12-12 MED ORDER — HYDROCODONE-ACETAMINOPHEN 10-325 MG PO TABS
1.0000 | ORAL_TABLET | ORAL | Status: DC | PRN
  Administered 2017-12-12: 1 via ORAL
  Filled 2017-12-12: qty 2

## 2017-12-12 MED ORDER — IOHEXOL 300 MG/ML  SOLN
INTRAMUSCULAR | Status: DC | PRN
  Administered 2017-12-12: 1 mL

## 2017-12-12 MED ORDER — CIPROFLOXACIN IN D5W 400 MG/200ML IV SOLN
INTRAVENOUS | Status: AC
  Filled 2017-12-12: qty 200

## 2017-12-12 MED ORDER — DEXAMETHASONE SODIUM PHOSPHATE 10 MG/ML IJ SOLN
INTRAMUSCULAR | Status: DC | PRN
  Administered 2017-12-12: 10 mg via INTRAVENOUS

## 2017-12-12 MED ORDER — HYDROCODONE-ACETAMINOPHEN 10-325 MG PO TABS
ORAL_TABLET | ORAL | Status: AC
  Filled 2017-12-12: qty 1

## 2017-12-12 MED ORDER — FENTANYL CITRATE (PF) 100 MCG/2ML IJ SOLN
25.0000 ug | INTRAMUSCULAR | Status: DC | PRN
  Administered 2017-12-12 (×2): 25 ug via INTRAVENOUS
  Filled 2017-12-12: qty 1

## 2017-12-12 MED ORDER — LIDOCAINE 2% (20 MG/ML) 5 ML SYRINGE
INTRAMUSCULAR | Status: DC | PRN
  Administered 2017-12-12: 60 mg via INTRAVENOUS

## 2017-12-12 SURGICAL SUPPLY — 25 items
BAG DRAIN URO-CYSTO SKYTR STRL (DRAIN) ×2 IMPLANT
BASKET ZERO TIP NITINOL 2.4FR (BASKET) ×2 IMPLANT
CATH INTERMIT  6FR 70CM (CATHETERS) ×2 IMPLANT
CATH URET 5FR 28IN CONE TIP (BALLOONS)
CATH URET 5FR 70CM CONE TIP (BALLOONS) IMPLANT
CLOTH BEACON ORANGE TIMEOUT ST (SAFETY) ×2 IMPLANT
EXTRACTOR STONE 1.7FRX115CM (UROLOGICAL SUPPLIES) IMPLANT
FIBER LASER FLEXIVA 365 (UROLOGICAL SUPPLIES) IMPLANT
FIBER LASER TRAC TIP (UROLOGICAL SUPPLIES) IMPLANT
GLOVE BIO SURGEON STRL SZ8 (GLOVE) ×2 IMPLANT
GOWN STRL REUS W/TWL XL LVL3 (GOWN DISPOSABLE) ×2 IMPLANT
GUIDEWIRE 0.038 PTFE COATED (WIRE) ×2 IMPLANT
GUIDEWIRE ANG ZIPWIRE 038X150 (WIRE) IMPLANT
GUIDEWIRE STR DUAL SENSOR (WIRE) ×2 IMPLANT
INFUSOR MANOMETER BAG 3000ML (MISCELLANEOUS) ×2 IMPLANT
IV NS IRRIG 3000ML ARTHROMATIC (IV SOLUTION) ×4 IMPLANT
KIT TURNOVER CYSTO (KITS) ×2 IMPLANT
MANIFOLD NEPTUNE II (INSTRUMENTS) ×2 IMPLANT
NS IRRIG 500ML POUR BTL (IV SOLUTION) ×2 IMPLANT
PACK CYSTO (CUSTOM PROCEDURE TRAY) ×2 IMPLANT
SHEATH URETERAL 12FRX28CM (UROLOGICAL SUPPLIES) ×2 IMPLANT
STENT URET 6FRX24 CONTOUR (STENTS) ×2 IMPLANT
TUBE CONNECTING 12X1/4 (SUCTIONS) ×2 IMPLANT
TUBING UROLOGY SET (TUBING) IMPLANT
WATER STERILE IRR 3000ML UROMA (IV SOLUTION) ×4 IMPLANT

## 2017-12-12 NOTE — Discharge Instructions (Signed)

## 2017-12-12 NOTE — Anesthesia Procedure Notes (Signed)
Procedure Name: LMA Insertion Date/Time: 12/12/2017 8:07 AM Performed by: Bonney Aid, CRNA Pre-anesthesia Checklist: Patient identified, Emergency Drugs available, Suction available and Patient being monitored Patient Re-evaluated:Patient Re-evaluated prior to induction Oxygen Delivery Method: Circle system utilized Preoxygenation: Pre-oxygenation with 100% oxygen Induction Type: IV induction Ventilation: Mask ventilation without difficulty LMA: LMA inserted LMA Size: 5.0 Number of attempts: 1 Airway Equipment and Method: Bite block Placement Confirmation: positive ETCO2 Tube secured with: Tape Dental Injury: Teeth and Oropharynx as per pre-operative assessment

## 2017-12-12 NOTE — Anesthesia Postprocedure Evaluation (Signed)
Anesthesia Post Note  Patient: Corey Zamora  Procedure(s) Performed: URETEROSCOPY WITH HOLMIUM LASER LITHOTRIPSY/ STENT PLACEMENT (Bilateral Ureter)     Patient location during evaluation: PACU Anesthesia Type: General Level of consciousness: awake and alert Pain management: pain level controlled Vital Signs Assessment: post-procedure vital signs reviewed and stable Respiratory status: spontaneous breathing, nonlabored ventilation, respiratory function stable and patient connected to nasal cannula oxygen Cardiovascular status: blood pressure returned to baseline and stable Postop Assessment: no apparent nausea or vomiting Anesthetic complications: no    Last Vitals:  Vitals:   12/12/17 0945 12/12/17 0954  BP: (!) 176/97 (!) 167/96  Pulse: (!) 53 (!) 59  Resp: (!) 9 14  Temp:    SpO2: 94% 100%                   Effie Berkshire

## 2017-12-12 NOTE — Transfer of Care (Signed)
Immediate Anesthesia Transfer of Care Note  Patient: Corey Zamora  Procedure(s) Performed: URETEROSCOPY WITH HOLMIUM LASER LITHOTRIPSY/ STENT PLACEMENT (Bilateral Ureter)  Patient Location: PACU  Anesthesia Type:General  Level of Consciousness: sedated  Airway & Oxygen Therapy: Patient Spontanous Breathing and Patient connected to nasal cannula oxygen  Post-op Assessment: Report given to RN  Post vital signs: Reviewed and stable  Last Vitals: 159/94, 60, 16, 100%, 98.3 Vitals Value Taken Time  BP    Temp    Pulse    Resp    SpO2      Last Pain:  Vitals:   12/12/17 0725  TempSrc:   PainSc: 0-No pain      Patients Stated Pain Goal: 8 (57/84/69 6295)  Complications: No apparent anesthesia complications

## 2017-12-12 NOTE — Anesthesia Preprocedure Evaluation (Addendum)
Anesthesia Evaluation  Patient identified by MRN, date of birth, ID band Patient awake    Reviewed: Allergy & Precautions, NPO status , Patient's Chart, lab work & pertinent test results  Airway Mallampati: I  TM Distance: >3 FB Neck ROM: Full    Dental  (+) Edentulous Upper, Edentulous Lower   Pulmonary neg pulmonary ROS,    breath sounds clear to auscultation       Cardiovascular negative cardio ROS   Rhythm:Regular Rate:Normal     Neuro/Psych negative neurological ROS     GI/Hepatic GERD  ,  Endo/Other  negative endocrine ROS  Renal/GU Renal disease     Musculoskeletal  (+) Arthritis ,   Abdominal Normal abdominal exam  (+)   Peds  Hematology negative hematology ROS (+)   Anesthesia Other Findings   Reproductive/Obstetrics                             Anesthesia Physical Anesthesia Plan  ASA: III  Anesthesia Plan: General   Post-op Pain Management:    Induction: Intravenous  PONV Risk Score and Plan: 3 and Ondansetron, Dexamethasone and Midazolam  Airway Management Planned: LMA  Additional Equipment: None  Intra-op Plan:   Post-operative Plan: Extubation in OR  Informed Consent: I have reviewed the patients History and Physical, chart, labs and discussed the procedure including the risks, benefits and alternatives for the proposed anesthesia with the patient or authorized representative who has indicated his/her understanding and acceptance.   Dental advisory given  Plan Discussed with: CRNA  Anesthesia Plan Comments:        Anesthesia Quick Evaluation

## 2017-12-12 NOTE — Op Note (Signed)
PATIENT:  Corey Zamora  PRE-OPERATIVE DIAGNOSIS: Bilateral ureteral calculi  POST-OPERATIVE DIAGNOSIS: Same  PROCEDURE:  1. Cystoscopy with right retrograde pyelogram including interpretation. 2. Right ureteroscopy and stone extraction. 3. Left ureteroscopy, laser lithotripsy and stent placement. 4. Fluoroscopy time less than 1 hour  SURGEON: Claybon Jabs, MD  INDICATION: Corey Zamora is a 77 year old male with bilateral distal ureteral stones that have been observed for some time with medical expulsive therapy in the hopes that they would pass spontaneously however his stones have not passed and we therefore have discussed proceeding with ureteroscopic management.  ANESTHESIA:  General  EBL:  Minimal  DRAINS: 6 Aquilar, 24 cm double-J stent in the left ureter (with string)  SPECIMEN: Stone given to patient  DESCRIPTION OF PROCEDURE: The patient was taken to the major OR and placed on the table. General anesthesia was administered and then the patient was moved to the dorsal lithotomy position. The genitalia was sterilely prepped and draped. An official timeout was performed.  Initially the 24 Sedgwick cystoscope with 30 lens was passed under direct vision into the bladder. The bladder was then fully inspected. It was noted be free of any tumors, stones or inflammatory lesions.  There was 3+ trabeculation and the prostate was noted to be elongated with trilobar hypertrophy.  Ureteral orifices were of normal configuration and position. A 6 Rice open-ended ureteral catheter was then passed through the cystoscope into the ureteral orifice in order to perform a right retrograde pyelogram.  A retrograde pyelogram was performed by injecting full-strength contrast up the right ureter under direct fluoroscopic control. It revealed a filling defect in the distal ureter consistent with the stone seen on previous KUB. The remainder of the ureter was noted to be normal as was the intrarenal  collecting system. I then passed a 0.038 inch floppy-tipped guidewire through the open ended catheter and into the area of the renal pelvis and this was left in place. The inner portion of a ureteral access sheath was then passed over the guidewire to gently dilate the intramural ureter. I then proceeded with ureteroscopy.  A 6 Denny rigid ureteroscope was then passed under direct into the bladder and into the right orifice and up the ureter.  Identified the stone and it was felt to be of a size that it could be extracted and therefore a 0 tip nitinol basket was passed through the ureteroscope and the stone was engaged.  I was then able to withdrawal the scope and stone simultaneously with no resistance.  I then passed the ureteroscope back into the bladder and identified the left ureteral orifice.  The guidewire was passed up the ureter and left in place as the ureteroscope was removed.  I again used the inner portion of the ureteral access sheath to dilate the intramural ureter and then proceeded with ureteroscopy using the 6 Hirt rigid scope.  The stone was identified and I felt it was too large to extract and therefore elected to proceed with laser lithotripsy. The 200  holmium laser fiber was used to fragment the stone. I then used the nitinol basket to extract all of the stone fragments and reinspection of the ureter ureteroscopically revealed no further stone fragments and no injury to the ureter. I then backloaded the cystoscope over the guidewire and passed the stent over the guidewire into the area of the renal pelvis. As the guidewire was removed good curl was noted in the renal pelvis. The bladder was drained and the cystoscope  was then removed. The patient tolerated the procedure well no intraoperative complications.  PLAN OF CARE: Discharge to home after PACU  PATIENT DISPOSITION:  PACU - hemodynamically stable.

## 2017-12-13 ENCOUNTER — Encounter (HOSPITAL_BASED_OUTPATIENT_CLINIC_OR_DEPARTMENT_OTHER): Payer: Self-pay | Admitting: Urology

## 2017-12-19 DIAGNOSIS — N201 Calculus of ureter: Secondary | ICD-10-CM | POA: Diagnosis not present

## 2017-12-26 DIAGNOSIS — Z23 Encounter for immunization: Secondary | ICD-10-CM | POA: Diagnosis not present

## 2018-02-02 DIAGNOSIS — N200 Calculus of kidney: Secondary | ICD-10-CM | POA: Diagnosis not present

## 2018-02-02 DIAGNOSIS — N281 Cyst of kidney, acquired: Secondary | ICD-10-CM | POA: Diagnosis not present

## 2018-02-23 ENCOUNTER — Encounter (INDEPENDENT_AMBULATORY_CARE_PROVIDER_SITE_OTHER): Payer: Self-pay | Admitting: Internal Medicine

## 2018-02-23 ENCOUNTER — Encounter (INDEPENDENT_AMBULATORY_CARE_PROVIDER_SITE_OTHER): Payer: Self-pay | Admitting: *Deleted

## 2018-02-23 ENCOUNTER — Ambulatory Visit (INDEPENDENT_AMBULATORY_CARE_PROVIDER_SITE_OTHER): Payer: PPO | Admitting: Internal Medicine

## 2018-02-23 VITALS — BP 180/90 | HR 68 | Temp 98.0°F | Ht 66.0 in | Wt 182.7 lb

## 2018-02-23 DIAGNOSIS — R1013 Epigastric pain: Secondary | ICD-10-CM

## 2018-02-23 DIAGNOSIS — R1310 Dysphagia, unspecified: Secondary | ICD-10-CM

## 2018-02-23 DIAGNOSIS — R1319 Other dysphagia: Secondary | ICD-10-CM

## 2018-02-23 DIAGNOSIS — K2270 Barrett's esophagus without dysplasia: Secondary | ICD-10-CM

## 2018-02-23 DIAGNOSIS — R10816 Epigastric abdominal tenderness: Secondary | ICD-10-CM | POA: Insufficient documentation

## 2018-02-23 DIAGNOSIS — K219 Gastro-esophageal reflux disease without esophagitis: Secondary | ICD-10-CM

## 2018-02-23 NOTE — Patient Instructions (Signed)
Will get an US abdomen. 

## 2018-02-23 NOTE — Progress Notes (Signed)
Subjective:    Patient ID: Corey Zamora, male    DOB: Dec 09, 1940, 77 y.o.   MRN: [id number]  HPI Here today for f/u. Last seen in December of 2018. Hx of Short segment Barrett's and colon polyps. Patient also has hx of dysphagia.  He tells me he is doing good. Recent hx of kidney stones. States his appetite is good. He has gained 4 pounds since his last visit with me. He has no trouble swallowing. He does c/o epigastric tenderness this morning which has been going on for a bout 2-3 months.  States his acid reflux is controlled. BMs are normal. He continues to drive.  Has an ex-wife at [facility] which he see daily. He remains active. Last colonoscopy in December of 2017 showed diverticulosis in the sigmoid colon, hepatic flexure and in the ascending colon. External hemorrhoids. EGD revealed normal upper third of esophagus and middle third of esophagus. Esophageal mucosal changes secondary to established short segment Barrett's disease. Normal stomach. Normal duodenal bulb and second portion of the duodenum.     Review of Systems Past Medical History:  Diagnosis Date  . Arthritis   . GERD (gastroesophageal reflux disease)   . History of kidney stones   . Hx of cataract surgery 2009   Bilateral eyes  . Kidney stones     Past Surgical History:  Procedure Laterality Date  . APPENDECTOMY    . COLONOSCOPY    . COLONOSCOPY N/A 02/27/2016   Procedure: COLONOSCOPY;  Surgeon: Rogene Houston, MD;  [facility];  Service: Endoscopy;  Laterality: N/A;  730  . CYSTOSCOPY WITH STENT PLACEMENT Left 08/04/2012   Procedure: CYSTOSCOPY WITH STENT PLACEMENT;  Surgeon: Hanley Ben, MD;  Location: [place];  Service: Urology;  Laterality: Left;  . CYSTOSCOPY/RETROGRADE/URETEROSCOPY/STONE EXTRACTION WITH BASKET Left 08/04/2012   Procedure: CYSTOSCOPY/RETROGRADE/URETEROSCOPY/STONE EXTRACTION WITH BASKET;  Surgeon: Hanley Ben, MD;  Location: [place];  Service: Urology;  Laterality: Left;  . ESOPHAGOGASTRODUODENOSCOPY N/A 02/27/2016   Procedure: ESOPHAGOGASTRODUODENOSCOPY (EGD);  Surgeon: Rogene Houston, MD;  [facility];  Service: Endoscopy;  Laterality: N/A;  . ESOPHAGOGASTRODUODENOSCOPY (EGD) WITH ESOPHAGEAL DILATION N/A 04/19/2013   Procedure: ESOPHAGOGASTRODUODENOSCOPY (EGD) WITH ESOPHAGEAL DILATION;  Surgeon: Rogene Houston, MD;  [facility];  Service: Endoscopy;  Laterality: N/A;  200-moved to [place] notified pt  . HOLMIUM LASER APPLICATION Left 6/60/6301   Procedure: HOLMIUM LASER APPLICATION;  Surgeon: Hanley Ben, MD;  Location: [place];  Service: Urology;  Laterality: Left;  . Right hand surgery    . URETEROSCOPY WITH HOLMIUM LASER LITHOTRIPSY Bilateral 12/12/2017   Procedure: URETEROSCOPY WITH HOLMIUM LASER LITHOTRIPSY/ STENT PLACEMENT;  Surgeon: Kathie Rhodes, MD;  Location: [facility];  Service: Urology;  Laterality: Bilateral;    Allergies  Allergen Reactions  . Aspirin Hives  . Codeine Nausea And Vomiting  . Penicillins Hives    Has patient had a PCN reaction causing immediate rash, facial/tongue/throat swelling, SOB or lightheadedness with hypotension: no Has patient had a PCN reaction causing severe rash involving mucus membranes or skin necrosis: no Has patient had a PCN reaction that required hospitalization no Has patient had a PCN reaction occurring within the last 10 years: no If all of the above answers are "NO", then may proceed with Cephalosporin use.    Current Outpatient Medications on File Prior to Visit  Medication Sig Dispense Refill  . pantoprazole (PROTONIX) 40 MG tablet TAKE  1 TABLET BY MOUTH ONCE DAILY 30 MINUTES BEFORE BREAKFAST 90 tablet 3   No current facility-administered medications on file prior to visit.         Objective:   Physical Exam Blood pressure (!) 180/90, pulse 68, temperature 98 F (36.7 C), height 5\' 6"   (1.676 m), weight 182 lb 11.2 oz (82.9 kg). Repeat blood pressure 156/84  Alert and oriented. Skin warm and dry. Oral mucosa is moist.   . Sclera anicteric, conjunctivae is pink. Thyroid not enlarged. No cervical lymphadenopathy. Lungs clear. Heart regular rate and rhythm.  Abdomen is soft. Bowel sounds are positive. No hepatomegaly. No abdominal masses felt. No tenderness.  No edema to lower extremities.           Assessment & Plan:  GERD is controlled. He will continue the Protonix. Dysphagia: He is not having any problems with Dysphagia at this time. His appetite ha remained good. Epigastric tenderness: Am going to get US abdomen. Colonic polyp. UTD on colonoscopy. Last colonoscopy 2017.

## 2018-03-01 ENCOUNTER — Ambulatory Visit (HOSPITAL_COMMUNITY)
Admission: RE | Admit: 2018-03-01 | Discharge: 2018-03-01 | Disposition: A | Discharge status: Home / Self Care | Payer: PPO | Source: Ambulatory Visit | Attending: Internal Medicine | Admitting: Internal Medicine

## 2018-03-01 DIAGNOSIS — N281 Cyst of kidney, acquired: Secondary | ICD-10-CM | POA: Insufficient documentation

## 2018-03-01 DIAGNOSIS — R1013 Epigastric pain: Secondary | ICD-10-CM | POA: Insufficient documentation

## 2018-03-01 DIAGNOSIS — N200 Calculus of kidney: Secondary | ICD-10-CM | POA: Diagnosis not present

## 2018-03-02 ENCOUNTER — Other Ambulatory Visit (INDEPENDENT_AMBULATORY_CARE_PROVIDER_SITE_OTHER): Payer: Self-pay | Admitting: Internal Medicine

## 2018-06-15 DIAGNOSIS — N181 Chronic kidney disease, stage 1: Secondary | ICD-10-CM | POA: Diagnosis not present

## 2018-06-15 DIAGNOSIS — I4510 Unspecified right bundle-branch block: Secondary | ICD-10-CM | POA: Diagnosis not present

## 2018-06-15 DIAGNOSIS — K2270 Barrett's esophagus without dysplasia: Secondary | ICD-10-CM | POA: Diagnosis not present

## 2018-06-15 DIAGNOSIS — Z79899 Other long term (current) drug therapy: Secondary | ICD-10-CM | POA: Diagnosis not present

## 2018-06-22 DIAGNOSIS — K7581 Nonalcoholic steatohepatitis (NASH): Secondary | ICD-10-CM | POA: Diagnosis not present

## 2018-06-22 DIAGNOSIS — I272 Pulmonary hypertension, unspecified: Secondary | ICD-10-CM | POA: Diagnosis not present

## 2018-06-22 DIAGNOSIS — K922 Gastrointestinal hemorrhage, unspecified: Secondary | ICD-10-CM | POA: Diagnosis not present

## 2018-06-22 DIAGNOSIS — K7469 Other cirrhosis of liver: Secondary | ICD-10-CM | POA: Diagnosis not present

## 2018-06-22 DIAGNOSIS — K219 Gastro-esophageal reflux disease without esophagitis: Secondary | ICD-10-CM | POA: Diagnosis not present

## 2018-06-22 DIAGNOSIS — K31819 Angiodysplasia of stomach and duodenum without bleeding: Secondary | ICD-10-CM | POA: Diagnosis not present

## 2018-06-22 DIAGNOSIS — N200 Calculus of kidney: Secondary | ICD-10-CM | POA: Diagnosis not present

## 2018-06-22 DIAGNOSIS — N183 Chronic kidney disease, stage 3 (moderate): Secondary | ICD-10-CM | POA: Diagnosis not present

## 2018-10-13 ENCOUNTER — Other Ambulatory Visit (INDEPENDENT_AMBULATORY_CARE_PROVIDER_SITE_OTHER): Payer: Self-pay | Admitting: Internal Medicine

## 2018-12-01 DIAGNOSIS — Z23 Encounter for immunization: Secondary | ICD-10-CM | POA: Diagnosis not present

## 2019-01-02 ENCOUNTER — Encounter (INDEPENDENT_AMBULATORY_CARE_PROVIDER_SITE_OTHER): Payer: Self-pay | Admitting: *Deleted

## 2019-01-19 ENCOUNTER — Other Ambulatory Visit (INDEPENDENT_AMBULATORY_CARE_PROVIDER_SITE_OTHER): Payer: Self-pay | Admitting: Internal Medicine

## 2019-02-26 ENCOUNTER — Ambulatory Visit (INDEPENDENT_AMBULATORY_CARE_PROVIDER_SITE_OTHER): Payer: PPO | Admitting: Nurse Practitioner

## 2019-03-07 ENCOUNTER — Ambulatory Visit (INDEPENDENT_AMBULATORY_CARE_PROVIDER_SITE_OTHER): Payer: PPO | Admitting: Nurse Practitioner

## 2019-03-07 ENCOUNTER — Encounter (INDEPENDENT_AMBULATORY_CARE_PROVIDER_SITE_OTHER): Payer: Self-pay | Admitting: Nurse Practitioner

## 2019-03-07 ENCOUNTER — Other Ambulatory Visit: Payer: Self-pay

## 2019-03-07 ENCOUNTER — Encounter (INDEPENDENT_AMBULATORY_CARE_PROVIDER_SITE_OTHER): Payer: Self-pay | Admitting: *Deleted

## 2019-03-07 VITALS — BP 149/74 | HR 71 | Temp 98.5°F | Ht 66.0 in | Wt 190.2 lb

## 2019-03-07 DIAGNOSIS — K2270 Barrett's esophagus without dysplasia: Secondary | ICD-10-CM | POA: Diagnosis not present

## 2019-03-07 NOTE — Patient Instructions (Signed)
1. Continue Pantoprazole 40mg  once daily  2. Schedule an EGD

## 2019-03-07 NOTE — Progress Notes (Signed)
Subjective:    Patient ID: Corey Zamora, male    DOB: 31-Dec-1940, 78 y.o.   MRN: [id number]  HPI Corey Zamora is an 78 year old male with a past medical history of arthritis, kidney stones s/p cystoscopy with right stone extraction 11/2017, GERD and Barrett's esophagus. Chronically chews tobacco. He presents today for his annual follow up appointment. He wishes to schedule an EGD as he worries about his risk of esophageal cancer. Dry foods such as cornbread will briefly get stuck in his upper esophagus. He drinks water and the food passes down the esophagus. If he avoids dry foods he does not have issues with dysphagia. No heartburn or stomach pain. His most recent EGD was 02/27/2016 which showed Barrett's esophagus without dysplasia. He is passing a normal formed brown BM daily. No rectal bleeding or hematochezia. No other complaints today.  EGD 02/27/2016: - Normal upper third of esophagus and middle third of esophagus. - Esophageal mucosal changes secondary to established short-segment Barrett's disease.   Biopsied. - Z-line irregular, 40 cm from the incisors. - Normal stomach. - Normal duodenal bulb and second portion of the duodenum. - BIOPSIES CONSISTENT WITH BARRETT'S ESOPHAGUS. - NO DYSPLASIA OR MALIGNANCY IDENTIFIED.  Colonoscopy 02/27/2016: - Diverticulosis in the sigmoid colon, at the hepatic flexure and in the ascending colon. - External hemorrhoids. - No specimens collected  Past Medical History:  Diagnosis Date  . Arthritis   . GERD (gastroesophageal reflux disease)   . History of kidney stones   . Hx of cataract surgery 2009   Bilateral eyes  . Kidney stones    Past Surgical History:  Procedure Laterality Date  . APPENDECTOMY    . COLONOSCOPY    . COLONOSCOPY N/A 02/27/2016   Procedure: COLONOSCOPY;  Surgeon: Rogene Houston, MD;  [facility];  Service: Endoscopy;  Laterality: N/A;  730  . CYSTOSCOPY WITH STENT PLACEMENT Left 08/04/2012   Procedure: CYSTOSCOPY WITH STENT PLACEMENT;  Surgeon: Hanley Ben, MD;  Location: [place];  Service: Urology;  Laterality: Left;  . CYSTOSCOPY/RETROGRADE/URETEROSCOPY/STONE EXTRACTION WITH BASKET Left 08/04/2012   Procedure: CYSTOSCOPY/RETROGRADE/URETEROSCOPY/STONE EXTRACTION WITH BASKET;  Surgeon: Hanley Ben, MD;  Location: [place];  Service: Urology;  Laterality: Left;  . ESOPHAGOGASTRODUODENOSCOPY N/A 02/27/2016   Procedure: ESOPHAGOGASTRODUODENOSCOPY (EGD);  Surgeon: Rogene Houston, MD;  [facility];  Service: Endoscopy;  Laterality: N/A;  . ESOPHAGOGASTRODUODENOSCOPY (EGD) WITH ESOPHAGEAL DILATION N/A 04/19/2013   Procedure: ESOPHAGOGASTRODUODENOSCOPY (EGD) WITH ESOPHAGEAL DILATION;  Surgeon: Rogene Houston, MD;  [facility];  Service: Endoscopy;  Laterality: N/A;  200-moved to [place] notified pt  . HOLMIUM LASER APPLICATION Left 0000000   Procedure: HOLMIUM LASER APPLICATION;  Surgeon: Hanley Ben, MD;  Location: [place];  Service: Urology;  Laterality: Left;  . Right hand surgery    . URETEROSCOPY WITH HOLMIUM LASER LITHOTRIPSY Bilateral 12/12/2017   Procedure: URETEROSCOPY WITH HOLMIUM LASER LITHOTRIPSY/ STENT PLACEMENT;  Surgeon: Kathie Rhodes, MD;  [facility];  Service: Urology;  Laterality: Bilateral;    Review of Systems See HPI     Objective:   Physical Exam  BP (!) 149/74 (BP Location: Right Arm, Patient Position: Sitting, Cuff Size: Large)   Pulse 71   Temp 98.5 F (36.9 C) (Oral)   Ht 5\' 6"  (1.676 m)   Wt 190 lb 3.2 oz (86.3 kg)   BMI 30.70 kg/m  General: 78 year old male well developed  alert in NAD Eyes: Sclera non-icteric, conjunctiva pink Mouth: Absent dentition, no ulcers or lesions, tongue with blue coating secondary to eating candy  Neck: Supple Heart: RRR, no murmur Lungs: Breath sounds clear throughout Abdomen: Soft, nontender, + BS x 4  quads, no masses or organomegaly Extremities: No edema Neuro: Alert and oriented x 4, no focal deficits     Assessment & Plan:   55. 78 year old male with GERD and Barrett's esophagus -Continue Pantoprazole 40mg  once daily -EGD benefits and risks dicussed including risk with sedation, risk of bleeding, infection and perforation  -Avoid dry foods and steak, large pieces of dry chicken and rice -Further follow up to be determined after EGD completed   2. Colon cancer screening, up to date -No further screening colonoscopies due to age

## 2019-03-12 ENCOUNTER — Other Ambulatory Visit (HOSPITAL_COMMUNITY)
Admission: RE | Admit: 2019-03-12 | Discharge: 2019-03-12 | Disposition: A | Discharge status: Home / Self Care | Payer: PPO | Source: Ambulatory Visit | Attending: Internal Medicine | Admitting: Internal Medicine

## 2019-03-12 ENCOUNTER — Other Ambulatory Visit: Payer: Self-pay

## 2019-03-12 DIAGNOSIS — Z01812 Encounter for preprocedural laboratory examination: Secondary | ICD-10-CM | POA: Insufficient documentation

## 2019-03-12 DIAGNOSIS — Z20828 Contact with and (suspected) exposure to other viral communicable diseases: Secondary | ICD-10-CM | POA: Insufficient documentation

## 2019-03-12 LAB — SARS CORONAVIRUS 2 (TAT 6-24 HRS): SARS Coronavirus 2: NEGATIVE

## 2019-03-14 ENCOUNTER — Ambulatory Visit (HOSPITAL_COMMUNITY)
Admission: RE | Admit: 2019-03-14 | Discharge: 2019-03-14 | Disposition: A | Discharge status: Home / Self Care | Payer: PPO | Attending: Internal Medicine | Admitting: Internal Medicine

## 2019-03-14 ENCOUNTER — Other Ambulatory Visit: Payer: Self-pay

## 2019-03-14 ENCOUNTER — Encounter (HOSPITAL_COMMUNITY): Payer: Self-pay | Admitting: Internal Medicine

## 2019-03-14 ENCOUNTER — Encounter (HOSPITAL_COMMUNITY)
Admission: RE | Disposition: A | Discharge status: Home / Self Care | Payer: Self-pay | Source: Home / Self Care | Attending: Internal Medicine

## 2019-03-14 DIAGNOSIS — Z880 Allergy status to penicillin: Secondary | ICD-10-CM | POA: Diagnosis not present

## 2019-03-14 DIAGNOSIS — K228 Other specified diseases of esophagus: Secondary | ICD-10-CM | POA: Insufficient documentation

## 2019-03-14 DIAGNOSIS — K2270 Barrett's esophagus without dysplasia: Secondary | ICD-10-CM

## 2019-03-14 DIAGNOSIS — K449 Diaphragmatic hernia without obstruction or gangrene: Secondary | ICD-10-CM | POA: Diagnosis not present

## 2019-03-14 DIAGNOSIS — Z720 Tobacco use: Secondary | ICD-10-CM | POA: Diagnosis not present

## 2019-03-14 DIAGNOSIS — M1990 Unspecified osteoarthritis, unspecified site: Secondary | ICD-10-CM | POA: Insufficient documentation

## 2019-03-14 DIAGNOSIS — Z886 Allergy status to analgesic agent status: Secondary | ICD-10-CM | POA: Diagnosis not present

## 2019-03-14 DIAGNOSIS — Z79899 Other long term (current) drug therapy: Secondary | ICD-10-CM | POA: Diagnosis not present

## 2019-03-14 DIAGNOSIS — K219 Gastro-esophageal reflux disease without esophagitis: Secondary | ICD-10-CM | POA: Insufficient documentation

## 2019-03-14 DIAGNOSIS — K317 Polyp of stomach and duodenum: Secondary | ICD-10-CM | POA: Insufficient documentation

## 2019-03-14 DIAGNOSIS — Z885 Allergy status to narcotic agent status: Secondary | ICD-10-CM | POA: Insufficient documentation

## 2019-03-14 HISTORY — PX: ESOPHAGOGASTRODUODENOSCOPY: SHX5428

## 2019-03-14 HISTORY — PX: BIOPSY: SHX5522

## 2019-03-14 SURGERY — EGD (ESOPHAGOGASTRODUODENOSCOPY)
Anesthesia: Moderate Sedation

## 2019-03-14 MED ORDER — LIDOCAINE VISCOUS HCL 2 % MT SOLN
OROMUCOSAL | Status: AC
  Filled 2019-03-14: qty 15

## 2019-03-14 MED ORDER — STERILE WATER FOR IRRIGATION IR SOLN
Status: DC | PRN
  Administered 2019-03-14: 1.5 mL

## 2019-03-14 MED ORDER — MIDAZOLAM HCL 5 MG/5ML IJ SOLN
INTRAMUSCULAR | Status: DC | PRN
  Administered 2019-03-14: 2 mg via INTRAVENOUS
  Administered 2019-03-14: 1 mg via INTRAVENOUS

## 2019-03-14 MED ORDER — SODIUM CHLORIDE 0.9 % IV SOLN: INTRAVENOUS | Status: DC

## 2019-03-14 MED ORDER — MIDAZOLAM HCL 5 MG/5ML IJ SOLN
INTRAMUSCULAR | Status: AC
  Filled 2019-03-14: qty 10

## 2019-03-14 MED ORDER — MEPERIDINE HCL 50 MG/ML IJ SOLN
INTRAMUSCULAR | Status: DC | PRN
  Administered 2019-03-14 (×2): 25 mg via INTRAVENOUS

## 2019-03-14 MED ORDER — LIDOCAINE VISCOUS HCL 2 % MT SOLN
OROMUCOSAL | Status: DC | PRN
  Administered 2019-03-14: 4 mL via OROMUCOSAL

## 2019-03-14 MED ORDER — MEPERIDINE HCL 50 MG/ML IJ SOLN
INTRAMUSCULAR | Status: AC
  Filled 2019-03-14: qty 1

## 2019-03-14 NOTE — H&P (Signed)
Corey Zamora is an 78 y.o. male.   Chief Complaint: Patient is here for esophagogastroduodenoscopy. HPI: Patient is 78 year old Caucasian male who has chronic GERD complicated by short segment Barrett's esophagus who was last EGD was in December 2017 who is here for surveillance examination.  Patient says heartburn is well controlled with therapy.  He does not dysphagia nausea vomiting or throat symptoms.  Since he chews tobacco he wants to be checked to make sure he is not developing any complications.  He has good appetite his weight has been stable.  He denies melena or rectal bleeding.  Past Medical History:  Diagnosis Date  . Arthritis   . GERD (gastroesophageal reflux disease)   . History of kidney stones   . Hx of cataract surgery 2009   Bilateral eyes  . Kidney stones     Past Surgical History:  Procedure Laterality Date  . APPENDECTOMY    . COLONOSCOPY    . COLONOSCOPY N/A 02/27/2016   Procedure: COLONOSCOPY;  Surgeon: Rogene Houston, MD;  [facility];  Service: Endoscopy;  Laterality: N/A;  730  . CYSTOSCOPY WITH STENT PLACEMENT Left 08/04/2012   Procedure: CYSTOSCOPY WITH STENT PLACEMENT;  Surgeon: Hanley Ben, MD;  Location: [place];  Service: Urology;  Laterality: Left;  . CYSTOSCOPY/RETROGRADE/URETEROSCOPY/STONE EXTRACTION WITH BASKET Left 08/04/2012   Procedure: CYSTOSCOPY/RETROGRADE/URETEROSCOPY/STONE EXTRACTION WITH BASKET;  Surgeon: Hanley Ben, MD;  Location: [place];  Service: Urology;  Laterality: Left;  . ESOPHAGOGASTRODUODENOSCOPY N/A 02/27/2016   Procedure: ESOPHAGOGASTRODUODENOSCOPY (EGD);  Surgeon: Rogene Houston, MD;  [facility];  Service: Endoscopy;  Laterality: N/A;  . ESOPHAGOGASTRODUODENOSCOPY (EGD) WITH ESOPHAGEAL DILATION N/A 04/19/2013   Procedure: ESOPHAGOGASTRODUODENOSCOPY (EGD) WITH ESOPHAGEAL DILATION;  Surgeon: Rogene Houston, MD;  [facility];  Service: Endoscopy;   Laterality: N/A;  200-moved to [place] notified pt  . HOLMIUM LASER APPLICATION Left 0000000   Procedure: HOLMIUM LASER APPLICATION;  Surgeon: Hanley Ben, MD;  Location: [place];  Service: Urology;  Laterality: Left;  . Right hand surgery    . URETEROSCOPY WITH HOLMIUM LASER LITHOTRIPSY Bilateral 12/12/2017   Procedure: URETEROSCOPY WITH HOLMIUM LASER LITHOTRIPSY/ STENT PLACEMENT;  Surgeon: Kathie Rhodes, MD;  Location[facility];  Service: Urology;  Laterality: Bilateral;    Family History  Problem Relation Age of Onset  . Colon cancer Brother    Social History:  reports that he has never smoked. His smokeless tobacco use includes chew. He reports that he does not drink alcohol or use drugs.  Allergies:  Allergies  Allergen Reactions  . Aspirin Hives  . Penicillins Hives    Did it involve swelling of the face/tongue/throat, SOB, or low BP? No Did it involve sudden or severe rash/hives, skin peeling, or any reaction on the inside of your mouth or nose? No Did you need to seek medical attention at a hospital or doctor's office? No When did it last happen?10 + years If all above answers are "NO", may proceed with cephalosporin use.   . Codeine Nausea And Vomiting and Rash    Medications Prior to Admission  Medication Sig Dispense Refill  . Cholecalciferol (VITAMIN D) 50 MCG (2000 UT) tablet Take 4,000 Units by mouth daily.    . pantoprazole (PROTONIX) 40 MG tablet TAKE 1 TABLET BY MOUTH ONCE DAILY 30 MINUTES BEFORE BREAKFAST (Patient taking differently: Take 40 mg by mouth daily with breakfast. ) 90 tablet 0  . acetaminophen (  TYLENOL) 500 MG tablet Take 500 mg by mouth every 8 (eight) hours as needed for moderate pain or headache.    . diphenhydrAMINE (BENADRYL) 25 MG tablet Take 25 mg by mouth daily as needed for allergies.    Marland Kitchen oxymetazoline (AFRIN) 0.05 % nasal spray Place 1 spray into both nostrils daily as needed for congestion.       No results found for this or any previous visit (from the past 48 hour(s)). No results found.  Review of Systems  Blood pressure (!) 158/79, pulse 68, temperature 99 F (37.2 C), temperature source Oral, resp. rate 12, height 5\' 6"  (1.676 m), weight 86.2 kg, SpO2 97 %. Physical Exam  Constitutional: He appears well-developed and well-nourished.  HENT:  Mouth/Throat: Oropharynx is clear and moist.  Evidence of uvulectomy.  Patient is edentulous.  Eyes: Conjunctivae are normal. No scleral icterus.  Cardiovascular: Normal rate, regular rhythm and normal heart sounds.  No murmur heard. Respiratory: Effort normal and breath sounds normal.  GI:  Abdomen is full but soft and nontender with organomegaly or masses.  Musculoskeletal:        General: No edema.  Neurological: He is alert.  Skin: Skin is warm and dry.     Assessment/Plan Chronic GERD complicated by short segment Barrett's esophagus. Surveillance esophagogastroduodenoscopy  Hildred Laser, MD 03/14/2019, 2:43 PM

## 2019-03-14 NOTE — Discharge Instructions (Signed)
No aspirin or NSAIDs for 24 hours. Resume usual medications as before. Resume usual diet. No driving for 24 hours. Physician will call with biopsy results.   Upper Endoscopy, Adult, Care After This sheet gives you information about how to care for yourself after your procedure. Your health care provider may also give you more specific instructions. If you have problems or questions, contact your health care provider. What can I expect after the procedure? After the procedure, it is common to have:  A sore throat.  Mild stomach pain or discomfort.  Bloating.  Nausea. Follow these instructions at home:   Follow instructions from your health care provider about what to eat or drink after your procedure.  Return to your normal activities as told by your health care provider. Ask your health care provider what activities are safe for you.  Take over-the-counter and prescription medicines only as told by your health care provider.  Do not drive for 24 hours if you were given a sedative during your procedure.  Keep all follow-up visits as told by your health care provider. This is important. Contact a health care provider if you have:  A sore throat that lasts longer than one day.  Trouble swallowing. Get help right away if:  You vomit blood or your vomit looks like coffee grounds.  You have: ? A fever. ? Bloody, black, or tarry stools. ? A severe sore throat or you cannot swallow. ? Difficulty breathing. ? Severe pain in your chest or abdomen. Summary  After the procedure, it is common to have a sore throat, mild stomach discomfort, bloating, and nausea.  Do not drive for 24 hours if you were given a sedative during the procedure.  Follow instructions from your health care provider about what to eat or drink after your procedure.  Return to your normal activities as told by your health care provider. This information is not intended to replace advice given to you by  your health care provider. Make sure you discuss any questions you have with your health care provider. Document Released: 09/07/2011 Document Revised: 08/30/2017 Document Reviewed: 08/08/2017 Elsevier Patient Education  2020 [employer].  Hiatal Hernia  A hiatal hernia occurs when part of the stomach slides above the muscle that separates the abdomen from the chest (diaphragm). A person can be born with a hiatal hernia (congenital), or it may develop over time. In almost all cases of hiatal hernia, only the top part of the stomach pushes through the diaphragm. Many people have a hiatal hernia with no symptoms. The larger the hernia, the more likely it is that you will have symptoms. In some cases, a hiatal hernia allows stomach acid to flow back into the tube that carries food from your mouth to your stomach (esophagus). This may cause heartburn symptoms. Severe heartburn symptoms may mean that you have developed a condition called gastroesophageal reflux disease (GERD). What are the causes? This condition is caused by a weakness in the opening (hiatus) where the esophagus passes through the diaphragm to attach to the upper part of the stomach. A person may be born with a weakness in the hiatus, or a weakness can develop over time. What increases the risk? This condition is more likely to develop in:  Older people. Age is a major risk factor for a hiatal hernia, especially if you are over the age of 13.  Pregnant women.  People who are overweight.  People who have frequent constipation. What are the signs or  symptoms? Symptoms of this condition usually develop in the form of GERD symptoms. Symptoms include:  Heartburn.  Belching.  Indigestion.  Trouble swallowing.  Coughing or wheezing.  Sore throat.  Hoarseness.  Chest pain.  Nausea and vomiting. How is this diagnosed? This condition may be diagnosed during testing for GERD. Tests that may be done include:  X-rays of  your stomach or chest.  An upper gastrointestinal (GI) series. This is an X-ray exam of your GI tract that is taken after you swallow a chalky liquid that shows up clearly on the X-ray.  Endoscopy. This is a procedure to look into your stomach using a thin, flexible tube that has a tiny camera and light on the end of it. How is this treated? This condition may be treated by:  Dietary and lifestyle changes to help reduce GERD symptoms.  Medicines. These may include: ? Over-the-counter antacids. ? Medicines that make your stomach empty more quickly. ? Medicines that block the production of stomach acid (H2 blockers). ? Stronger medicines to reduce stomach acid (proton pump inhibitors).  Surgery to repair the hernia, if other treatments are not helping. If you have no symptoms, you may not need treatment. Follow these instructions at home: Lifestyle and activity  Do not use any products that contain nicotine or tobacco, such as cigarettes and e-cigarettes. If you need help quitting, ask your health care provider.  Try to achieve and maintain a healthy body weight.  Avoid putting pressure on your abdomen. Anything that puts pressure on your abdomen increases the amount of acid that may be pushed up into your esophagus. ? Avoid bending over, especially after eating. ? Raise the head of your bed by putting blocks under the legs. This keeps your head and esophagus higher than your stomach. ? Do not wear tight clothing around your chest or stomach. ? Try not to strain when having a bowel movement, when urinating, or when lifting heavy objects. Eating and drinking  Avoid foods that can worsen GERD symptoms. These may include: ? Fatty foods, like fried foods. ? Citrus fruits, like oranges or lemon. ? Other foods and drinks that contain acid, like orange juice or tomatoes. ? Spicy food. ? Chocolate.  Eat frequent small meals instead of three large meals a day. This helps prevent your  stomach from getting too full. ? Eat slowly. ? Do not lie down right after eating. ? Do not eat 1-2 hours before bed.  Do not drink beverages with caffeine. These include cola, coffee, cocoa, and tea.  Do not drink alcohol. General instructions  Take over-the-counter and prescription medicines only as told by your health care provider.  Keep all follow-up visits as told by your health care provider. This is important. Contact a health care provider if:  Your symptoms are not controlled with medicines or lifestyle changes.  You are having trouble swallowing.  You have coughing or wheezing that will not go away. Get help right away if:  Your pain is getting worse.  Your pain spreads to your arms, neck, jaw, teeth, or back.  You have shortness of breath.  You sweat for no reason.  You feel sick to your stomach (nauseous) or you vomit.  You vomit blood.  You have bright red blood in your stools.  You have black, tarry stools. This information is not intended to replace advice given to you by your health care provider. Make sure you discuss any questions you have with your health care provider. Document  Released: 05/29/2003 Document Revised: 02/18/2017 Document Reviewed: 10/11/2016 Elsevier Patient Education  2020 [employer].

## 2019-03-14 NOTE — Op Note (Signed)
[facility] Patient Name: Corey Zamora Procedure Date: 03/14/2019 2:33 PM MRN: [id number] Date of Birth: 1940-06-17 Attending MD: Hildred Laser , MD CSN: [id number] Age: 78 Admit Type: Outpatient Procedure:                Upper GI endoscopy Indications:              Follow-up of Barrett's esophagus Providers:                Hildred Laser, MD, Otis Peak B. Sharon Seller, RN, Raphael Gibney, Technician Referring MD:             Asencion Noble, MD Medicines:                Lidocaine spray, Meperidine 50 mg IV, Midazolam 2                            mg IV Complications:            No immediate complications. Estimated Blood Loss:     Estimated blood loss was minimal. Procedure:                Pre-Anesthesia Assessment:                           - Prior to the procedure, a History and Physical                            was performed, and patient medications and                            allergies were reviewed. The patient's tolerance of                            previous anesthesia was also reviewed. The risks                            and benefits of the procedure and the sedation                            options and risks were discussed with the patient.                            All questions were answered, and informed consent                            was obtained. Prior Anticoagulants: The patient has                            taken no previous anticoagulant or antiplatelet                            agents. ASA Grade Assessment: I - A normal, healthy  patient. After reviewing the risks and benefits,                            the patient was deemed in satisfactory condition to                            undergo the procedure.                           After obtaining informed consent, the endoscope was                            passed under direct vision. Throughout the                            procedure, the patient's  blood pressure, pulse, and                            oxygen saturations were monitored continuously. The                            [id number]) scope was introduced through the                            mouth, and advanced to the second part of duodenum.                            The upper GI endoscopy was accomplished without                            difficulty. The patient tolerated the procedure                            well. Scope In: 2:52:01 PM Scope Out: 3:02:34 PM Total Procedure Duration: 0 hours 10 minutes 33 seconds  Findings:      The examined esophagus was normal.      The Z-line was irregular and was found 38 cm from the incisors. Biopsies       were taken with a cold forceps for histology. The pathology specimen was       placed into Bottle Number 2.      A 2 cm hiatal hernia was present.      A few 3 to 7 mm pedunculated and sessile polyps with no stigmata of       recent bleeding were found in the gastric fundus and in the gastric       body. Biopsies were taken from five of these polyps with a cold forceps       for histology. The pathology specimen was placed into Bottle Number 1.      The exam of the stomach was otherwise normal.      The duodenal bulb and second portion of the duodenum were normal. Impression:               - Normal esophagus.                           -  Z-line irregular, 38 cm from the incisors.                            Biopsied. No obvious Barretts mucosa.                           - 2 cm hiatal hernia.                           - A few gastric polyps. Five biopsied.                           - Normal duodenal bulb and second portion of the                            duodenum. Moderate Sedation:      Moderate (conscious) sedation was administered by the endoscopy nurse       and supervised by the endoscopist. The following parameters were       monitored: oxygen saturation, heart rate, blood pressure, CO2       capnography and  response to care. Total physician intraservice time was       14 minutes. Recommendation:           - Patient has a contact number available for                            emergencies. The signs and symptoms of potential                            delayed complications were discussed with the                            patient. Return to normal activities tomorrow.                            Written discharge instructions were provided to the                            patient.                           - Resume previous diet today.                           - Continue present medications.                           - No aspirin, ibuprofen, naproxen, or other                            non-steroidal anti-inflammatory drugs for 1 day.                           - Await pathology results. Procedure Code(s):        --- Professional ---  [id number], Esophagogastroduodenoscopy, flexible,                            transoral; with biopsy, single or multiple                           G0500, Moderate sedation services provided by the                            same physician or other qualified health care                            professional performing a gastrointestinal                            endoscopic service that sedation supports,                            requiring the presence of an independent trained                            observer to assist in the monitoring of the                            patient's level of consciousness and physiological                            status; initial 15 minutes of intra-service time;                            patient age 9 years or older (additional time may                            be reported with [phone], as appropriate) Diagnosis Code(s):        --- Professional ---                           K22.70, Barrett's esophagus without dysplasia                           K22.8, Other specified diseases of esophagus                            K44.9, Diaphragmatic hernia without obstruction or                            gangrene                           K31.7, Polyp of stomach and duodenum CPT copyright 2019 American Medical Association. All rights reserved. The codes documented in this report are preliminary and upon coder review may  be revised to meet current compliance requirements. Hildred Laser, MD Hildred Laser, MD 03/14/2019 3:10:29 PM This report has been signed electronically. Number of Addenda: 0

## 2019-03-19 ENCOUNTER — Other Ambulatory Visit: Payer: Self-pay

## 2019-03-19 LAB — SURGICAL PATHOLOGY

## 2019-04-30 ENCOUNTER — Other Ambulatory Visit (INDEPENDENT_AMBULATORY_CARE_PROVIDER_SITE_OTHER): Payer: Self-pay | Admitting: Internal Medicine

## 2019-07-09 DIAGNOSIS — K2270 Barrett's esophagus without dysplasia: Secondary | ICD-10-CM | POA: Diagnosis not present

## 2019-07-09 DIAGNOSIS — Z79899 Other long term (current) drug therapy: Secondary | ICD-10-CM | POA: Diagnosis not present

## 2019-07-09 DIAGNOSIS — N183 Chronic kidney disease, stage 3 unspecified: Secondary | ICD-10-CM | POA: Diagnosis not present

## 2019-07-09 DIAGNOSIS — Z720 Tobacco use: Secondary | ICD-10-CM | POA: Diagnosis not present

## 2019-07-17 DIAGNOSIS — Z6829 Body mass index (BMI) 29.0-29.9, adult: Secondary | ICD-10-CM | POA: Diagnosis not present

## 2019-07-17 DIAGNOSIS — Z0001 Encounter for general adult medical examination with abnormal findings: Secondary | ICD-10-CM | POA: Diagnosis not present

## 2019-07-17 DIAGNOSIS — K219 Gastro-esophageal reflux disease without esophagitis: Secondary | ICD-10-CM | POA: Diagnosis not present

## 2019-07-17 DIAGNOSIS — N1831 Chronic kidney disease, stage 3a: Secondary | ICD-10-CM | POA: Diagnosis not present

## 2019-07-17 DIAGNOSIS — I4510 Unspecified right bundle-branch block: Secondary | ICD-10-CM | POA: Diagnosis not present

## 2019-08-04 ENCOUNTER — Other Ambulatory Visit (INDEPENDENT_AMBULATORY_CARE_PROVIDER_SITE_OTHER): Payer: Self-pay | Admitting: Gastroenterology

## 2019-08-06 NOTE — Telephone Encounter (Signed)
Patient will need to have appointment with Thayer Headings or Dr.Catenda per Dr.Rehman. Prior to further refills. He was given a 3 month supply.

## 2019-11-08 ENCOUNTER — Ambulatory Visit (INDEPENDENT_AMBULATORY_CARE_PROVIDER_SITE_OTHER): Payer: PPO | Admitting: Gastroenterology

## 2019-12-03 ENCOUNTER — Ambulatory Visit (INDEPENDENT_AMBULATORY_CARE_PROVIDER_SITE_OTHER): Payer: PPO | Admitting: Gastroenterology

## 2020-01-07 DIAGNOSIS — Z23 Encounter for immunization: Secondary | ICD-10-CM | POA: Diagnosis not present

## 2020-04-24 DIAGNOSIS — H43813 Vitreous degeneration, bilateral: Secondary | ICD-10-CM | POA: Diagnosis not present

## 2020-04-24 DIAGNOSIS — H524 Presbyopia: Secondary | ICD-10-CM | POA: Diagnosis not present

## 2020-07-22 ENCOUNTER — Other Ambulatory Visit (INDEPENDENT_AMBULATORY_CARE_PROVIDER_SITE_OTHER): Payer: Self-pay | Admitting: Internal Medicine

## 2020-07-27 ENCOUNTER — Other Ambulatory Visit (INDEPENDENT_AMBULATORY_CARE_PROVIDER_SITE_OTHER): Payer: Self-pay | Admitting: Internal Medicine

## 2020-08-20 ENCOUNTER — Other Ambulatory Visit (INDEPENDENT_AMBULATORY_CARE_PROVIDER_SITE_OTHER): Payer: Self-pay | Admitting: Internal Medicine

## 2020-09-26 DIAGNOSIS — I4510 Unspecified right bundle-branch block: Secondary | ICD-10-CM | POA: Diagnosis not present

## 2020-09-26 DIAGNOSIS — Z79899 Other long term (current) drug therapy: Secondary | ICD-10-CM | POA: Diagnosis not present

## 2020-09-26 DIAGNOSIS — N183 Chronic kidney disease, stage 3 unspecified: Secondary | ICD-10-CM | POA: Diagnosis not present

## 2020-09-26 DIAGNOSIS — K219 Gastro-esophageal reflux disease without esophagitis: Secondary | ICD-10-CM | POA: Diagnosis not present

## 2020-09-26 DIAGNOSIS — E785 Hyperlipidemia, unspecified: Secondary | ICD-10-CM | POA: Diagnosis not present

## 2020-10-03 DIAGNOSIS — I4510 Unspecified right bundle-branch block: Secondary | ICD-10-CM | POA: Diagnosis not present

## 2020-10-03 DIAGNOSIS — C4491 Basal cell carcinoma of skin, unspecified: Secondary | ICD-10-CM | POA: Diagnosis not present

## 2020-10-03 DIAGNOSIS — K219 Gastro-esophageal reflux disease without esophagitis: Secondary | ICD-10-CM | POA: Diagnosis not present

## 2020-10-03 DIAGNOSIS — N1831 Chronic kidney disease, stage 3a: Secondary | ICD-10-CM | POA: Diagnosis not present

## 2020-10-03 DIAGNOSIS — N200 Calculus of kidney: Secondary | ICD-10-CM | POA: Diagnosis not present

## 2020-10-27 DIAGNOSIS — C44319 Basal cell carcinoma of skin of other parts of face: Secondary | ICD-10-CM | POA: Diagnosis not present

## 2020-10-27 DIAGNOSIS — D225 Melanocytic nevi of trunk: Secondary | ICD-10-CM | POA: Diagnosis not present

## 2020-11-03 DIAGNOSIS — H35363 Drusen (degenerative) of macula, bilateral: Secondary | ICD-10-CM | POA: Diagnosis not present

## 2020-11-25 ENCOUNTER — Other Ambulatory Visit (INDEPENDENT_AMBULATORY_CARE_PROVIDER_SITE_OTHER): Payer: Self-pay | Admitting: Internal Medicine

## 2020-12-23 ENCOUNTER — Encounter (HOSPITAL_COMMUNITY): Payer: Self-pay

## 2020-12-23 ENCOUNTER — Other Ambulatory Visit: Payer: Self-pay

## 2020-12-23 ENCOUNTER — Ambulatory Visit
Admission: EM | Admit: 2020-12-23 | Discharge: 2020-12-23 | Disposition: A | Discharge status: Home / Self Care | Payer: PPO

## 2020-12-23 ENCOUNTER — Emergency Department (HOSPITAL_COMMUNITY): Payer: PPO

## 2020-12-23 ENCOUNTER — Emergency Department (HOSPITAL_COMMUNITY)
Admission: EM | Admit: 2020-12-23 | Discharge: 2020-12-23 | Disposition: A | Discharge status: Home / Self Care | Payer: PPO | Attending: Emergency Medicine | Admitting: Emergency Medicine

## 2020-12-23 DIAGNOSIS — R197 Diarrhea, unspecified: Secondary | ICD-10-CM | POA: Insufficient documentation

## 2020-12-23 DIAGNOSIS — R110 Nausea: Secondary | ICD-10-CM | POA: Diagnosis not present

## 2020-12-23 DIAGNOSIS — R1013 Epigastric pain: Secondary | ICD-10-CM | POA: Insufficient documentation

## 2020-12-23 DIAGNOSIS — R109 Unspecified abdominal pain: Secondary | ICD-10-CM | POA: Diagnosis not present

## 2020-12-23 DIAGNOSIS — R001 Bradycardia, unspecified: Secondary | ICD-10-CM | POA: Diagnosis not present

## 2020-12-23 LAB — COMPREHENSIVE METABOLIC PANEL
ALT: 16 U/L (ref 0–44)
AST: 16 U/L (ref 15–41)
Albumin: 3.9 g/dL (ref 3.5–5.0)
Alkaline Phosphatase: 64 U/L (ref 38–126)
Anion gap: 7 (ref 5–15)
BUN: 16 mg/dL (ref 8–23)
CO2: 25 mmol/L (ref 22–32)
Calcium: 9.4 mg/dL (ref 8.9–10.3)
Chloride: 105 mmol/L (ref 98–111)
Creatinine, Ser: 1.31 mg/dL — ABNORMAL HIGH (ref 0.61–1.24)
GFR, Estimated: 54 mL/min — ABNORMAL LOW (ref >60)
Glucose, Bld: 105 mg/dL — ABNORMAL HIGH (ref 70–99)
Potassium: 4.5 mmol/L (ref 3.5–5.1)
Sodium: 137 mmol/L (ref 135–145)
Total Bilirubin: 0.9 mg/dL (ref 0.3–1.2)
Total Protein: 7.3 g/dL (ref 6.5–8.1)

## 2020-12-23 LAB — CBC
HCT: 49.1 % (ref 39.0–52.0)
Hemoglobin: 15.9 g/dL (ref 13.0–17.0)
MCH: 31.7 pg (ref 26.0–34.0)
MCHC: 32.4 g/dL (ref 30.0–36.0)
MCV: 97.8 fL (ref 80.0–100.0)
Platelets: 153 10*3/uL (ref 150–400)
RBC: 5.02 MIL/uL (ref 4.22–5.81)
RDW: 13.2 % (ref 11.5–15.5)
WBC: 7.4 10*3/uL (ref 4.0–10.5)
nRBC: 0 % (ref 0.0–0.2)

## 2020-12-23 LAB — LIPASE, BLOOD: Lipase: 25 U/L (ref 11–51)

## 2020-12-23 LAB — DIFFERENTIAL
Abs Immature Granulocytes: 0.02 10*3/uL (ref 0.00–0.07)
Basophils Absolute: 0 10*3/uL (ref 0.0–0.1)
Basophils Relative: 1 %
Eosinophils Absolute: 0.1 10*3/uL (ref 0.0–0.5)
Eosinophils Relative: 2 %
Immature Granulocytes: 0 %
Lymphocytes Relative: 18 %
Lymphs Abs: 1.3 10*3/uL (ref 0.7–4.0)
Monocytes Absolute: 0.9 10*3/uL (ref 0.1–1.0)
Monocytes Relative: 12 %
Neutro Abs: 5 10*3/uL (ref 1.7–7.7)
Neutrophils Relative %: 67 %

## 2020-12-23 LAB — URINALYSIS, ROUTINE W REFLEX MICROSCOPIC
Bilirubin Urine: NEGATIVE
Glucose, UA: NEGATIVE mg/dL
Hgb urine dipstick: NEGATIVE
Ketones, ur: NEGATIVE mg/dL
Leukocytes,Ua: NEGATIVE
Nitrite: NEGATIVE
Protein, ur: NEGATIVE mg/dL
Specific Gravity, Urine: 1.041 — ABNORMAL HIGH (ref 1.005–1.030)
pH: 5 (ref 5.0–8.0)

## 2020-12-23 MED ORDER — ALUM & MAG HYDROXIDE-SIMETH 200-200-20 MG/5ML PO SUSP: 15.0000 mL | Freq: Four times a day (QID) | ORAL | 0 refills | Status: DC | PRN

## 2020-12-23 MED ORDER — FENTANYL CITRATE PF 50 MCG/ML IJ SOSY
50.0000 ug | PREFILLED_SYRINGE | Freq: Once | INTRAMUSCULAR | Status: AC
  Administered 2020-12-23: 50 ug via INTRAVENOUS
  Filled 2020-12-23: qty 1

## 2020-12-23 MED ORDER — IOHEXOL 300 MG/ML  SOLN
100.0000 mL | Freq: Once | INTRAMUSCULAR | Status: AC | PRN
  Administered 2020-12-23: 100 mL via INTRAVENOUS

## 2020-12-23 MED ORDER — ONDANSETRON HCL 4 MG/2ML IJ SOLN
4.0000 mg | Freq: Once | INTRAMUSCULAR | Status: AC
  Administered 2020-12-23: 4 mg via INTRAVENOUS
  Filled 2020-12-23: qty 2

## 2020-12-23 MED ORDER — SODIUM CHLORIDE 0.9 % IV BOLUS
500.0000 mL | Freq: Once | INTRAVENOUS | Status: AC
  Administered 2020-12-23: 500 mL via INTRAVENOUS

## 2020-12-23 MED ORDER — LIDOCAINE VISCOUS HCL 2 % MT SOLN
15.0000 mL | Freq: Once | OROMUCOSAL | Status: AC
  Administered 2020-12-23: 15 mL via ORAL
  Filled 2020-12-23: qty 15

## 2020-12-23 MED ORDER — PANTOPRAZOLE SODIUM 20 MG PO TBEC: 20.0000 mg | DELAYED_RELEASE_TABLET | Freq: Every day | ORAL | 0 refills | Status: DC

## 2020-12-23 MED ORDER — ONDANSETRON HCL 4 MG/2ML IJ SOLN
INTRAMUSCULAR | Status: AC
  Administered 2020-12-23: 4 mg via INTRAVENOUS
  Filled 2020-12-23: qty 2

## 2020-12-23 MED ORDER — ALUM & MAG HYDROXIDE-SIMETH 200-200-20 MG/5ML PO SUSP
30.0000 mL | Freq: Once | ORAL | Status: AC
  Administered 2020-12-23: 30 mL via ORAL
  Filled 2020-12-23: qty 30

## 2020-12-23 NOTE — ED Triage Notes (Signed)
Pt presents to ED with complaints of epigastric pain and diarrhea started last night.

## 2020-12-23 NOTE — ED Provider Notes (Signed)
  Face-to-face evaluation   History: He is here for evaluation of upper abdominal pain associated with belching and diarrhea which started during the night last night.  He is almost out of his pantoprazole but did take a dose this morning.  During the night he took some Mylanta which seemed to help.  He did not see blood in his diarrhea.  He has had endoscopies showing esophageal stricture, had a stretching procedure done at least once, and otherwise had a normal endoscopy.  He uses chewing tobacco, frequently.  He denies fever, chills, cough, focal weakness or paresthesia.  He is here with his daughter who helps to give history.  Physical exam: Elderly male, alert and cooperative.  Abdomen is soft nontender palpation.  No respiratory distress.  No dysarthria or aphasia  Medical screening examination/treatment/procedure(s) were conducted as a shared visit with non-physician practitioner(s) and myself.  I personally evaluated the patient during the encounter    Daleen Bo, MD 12/24/20 1023

## 2020-12-23 NOTE — Discharge Instructions (Addendum)
Take mylanta before meals and at bedtime. Take 40 mg protonix daily 30 minutes before first meal of day. Follow up with your GI doctor. Return if things change or worsen.

## 2020-12-23 NOTE — ED Provider Notes (Signed)
[facility] EMERGENCY DEPARTMENT Provider Note   CSN: [id number] Arrival date & time: 12/23/20  0847     History Chief Complaint  Patient presents with   Abdominal Pain    Corey Zamora is a 80 y.o. male.   Abdominal Pain Associated symptoms: diarrhea and nausea   Associated symptoms: no chest pain, no chills, no cough, no dysuria, no fever, no hematuria, no shortness of breath, no sore throat and no vomiting     Presents with acute epigastric abdominal pain. Started last night, constant. Unable to characterize pain. Does not radiate to back. Assoc with 2 episodes of diarrhea - resolved with lomotil. Also has nausea and pyrosis, no vomiting. S/P appendectomy, no other abdominal surgeries.   H/O of barett's esophagus, been of PPI for 2 years.   Past Medical History:  Diagnosis Date   Arthritis    GERD (gastroesophageal reflux disease)    History of kidney stones    Hx of cataract surgery 2009   Bilateral eyes   Kidney stones     Patient Active Problem List   Diagnosis Date Noted   Epigastric abdominal tenderness 02/23/2018   Hx of colonic polyps 01/21/2016   Family history of colon cancer 01/21/2016   Barrett's esophagus without dysplasia 01/21/2016   Barrett's esophagus 03/27/2013   Dysphagia 03/08/2012   Colonic adenoma 03/08/2012    Past Surgical History:  Procedure Laterality Date   APPENDECTOMY     BIOPSY  03/14/2019   Procedure: BIOPSY;  Surgeon: Rogene Houston, MD;  [facility];  Service: Endoscopy;;  gastric esophagus   COLONOSCOPY     COLONOSCOPY N/A 02/27/2016   Procedure: COLONOSCOPY;  Surgeon: Rogene Houston, MD;  [facility];  Service: Endoscopy;  Laterality: N/A;  [place] Left 08/04/2012   Procedure: CYSTOSCOPY WITH STENT PLACEMENT;  Surgeon: Hanley Ben, MD;  Location: [place];  Service: Urology;  Laterality: Left;   CYSTOSCOPY/RETROGRADE/URETEROSCOPY/STONE EXTRACTION  WITH BASKET Left 08/04/2012   Procedure: CYSTOSCOPY/RETROGRADE/URETEROSCOPY/STONE EXTRACTION WITH BASKET;  Surgeon: Hanley Ben, MD;  Location: [place];  Service: Urology;  Laterality: Left;   ESOPHAGOGASTRODUODENOSCOPY N/A 02/27/2016   Procedure: ESOPHAGOGASTRODUODENOSCOPY (EGD);  Surgeon: Rogene Houston, MD;  [facility];  Service: Endoscopy;  Laterality: N/A;   ESOPHAGOGASTRODUODENOSCOPY N/A 03/14/2019   Procedure: ESOPHAGOGASTRODUODENOSCOPY (EGD);  Surgeon: Rogene Houston, MD;  [facility];  Service: Endoscopy;  Laterality: N/A;  2:50   ESOPHAGOGASTRODUODENOSCOPY (EGD) WITH ESOPHAGEAL DILATION N/A 04/19/2013   Procedure: ESOPHAGOGASTRODUODENOSCOPY (EGD) WITH ESOPHAGEAL DILATION;  Surgeon: Rogene Houston, MD;  [facility];  Service: Endoscopy;  Laterality: N/A;  200-moved to [place] notified pt   HOLMIUM LASER APPLICATION Left 3/55/9741   Procedure: HOLMIUM LASER APPLICATION;  Surgeon: Hanley Ben, MD;  Location: [place];  Service: Urology;  Laterality: Left;   Right hand surgery     URETEROSCOPY WITH HOLMIUM LASER LITHOTRIPSY Bilateral 12/12/2017   Procedure: URETEROSCOPY WITH HOLMIUM LASER LITHOTRIPSY/ STENT PLACEMENT;  Surgeon: Kathie Rhodes, MD;  Location: [facility];  Service: Urology;  Laterality: Bilateral;       Family History  Problem Relation Age of Onset   Colon cancer Brother     Social History   Tobacco Use   Smoking status: Never   Smokeless tobacco: Current    Types: Chew   Tobacco comments:    chews tobacco  Substance Use Topics  Alcohol use: No   Drug use: No    Home Medications Prior to Admission medications   Medication Sig Start Date End Date Taking? Authorizing Provider  acetaminophen (TYLENOL) 500 MG tablet Take 500 mg by mouth every 8 (eight) hours as needed for moderate pain or headache.    [provider]  Cholecalciferol (VITAMIN D) 50 MCG  (2000 UT) tablet Take 4,000 Units by mouth daily.    [provider]  diphenhydrAMINE (BENADRYL) 25 MG tablet Take 25 mg by mouth daily as needed for allergies.    [provider]  oxymetazoline (AFRIN) 0.05 % nasal spray Place 1 spray into both nostrils daily as needed for congestion.    [provider]  pantoprazole (PROTONIX) 40 MG tablet TAKE 1 TABLET BY MOUTH ONCE DAILY 30 MINUTES BEFORE BREAKFAST 08/20/20   Rehman, Mechele Dawley, MD    Allergies    Aspirin, Penicillins, and Codeine  Review of Systems   Review of Systems  Constitutional:  Negative for chills and fever.  HENT:  Negative for ear pain and sore throat.   Eyes:  Negative for pain and visual disturbance.  Respiratory:  Negative for cough and shortness of breath.   Cardiovascular:  Negative for chest pain and palpitations.  Gastrointestinal:  Positive for abdominal pain, diarrhea and nausea. Negative for vomiting.  Genitourinary:  Negative for dysuria and hematuria.  Musculoskeletal:  Negative for arthralgias and back pain.  Skin:  Negative for color change and rash.  Neurological:  Negative for seizures and syncope.  All other systems reviewed and are negative.  Physical Exam Updated Vital Signs BP (!) 164/84 (BP Location: Right Arm)   Pulse 62   Temp 98.5 F (36.9 C) (Oral)   Resp 16   SpO2 98%   Physical Exam Vitals and nursing note reviewed. Exam conducted with a chaperone present.  Constitutional:      Appearance: Normal appearance.  HENT:     Head: Normocephalic and atraumatic.  Eyes:     General: No scleral icterus.       Right eye: No discharge.        Left eye: No discharge.     Extraocular Movements: Extraocular movements intact.     Pupils: Pupils are equal, round, and reactive to light.  Cardiovascular:     Rate and Rhythm: Normal rate and regular rhythm.     Pulses: Normal pulses.     Heart sounds: Normal heart sounds. No murmur heard.   No friction rub. No gallop.   Pulmonary:     Effort: Pulmonary effort is normal. No respiratory distress.     Breath sounds: Normal breath sounds.  Abdominal:     General: Abdomen is flat and protuberant. Bowel sounds are normal. There is no distension.     Palpations: Abdomen is soft.     Tenderness: There is abdominal tenderness in the epigastric area. There is guarding. There is no rebound.  Skin:    General: Skin is warm and dry.     Coloration: Skin is not jaundiced.  Neurological:     Mental Status: He is alert. Mental status is at baseline.     Coordination: Coordination normal.    ED Results / Procedures / Treatments   Labs (all labs ordered are listed, but only abnormal results are displayed) Labs Reviewed  LIPASE, BLOOD  COMPREHENSIVE METABOLIC PANEL  CBC  URINALYSIS, ROUTINE W REFLEX MICROSCOPIC    EKG None  Radiology No results found.  Procedures Procedures  Medications Ordered in ED Medications - No data to display  ED Course  I have reviewed the triage vital signs and the nursing notes.  Pertinent labs & imaging results that were available during my care of the patient were reviewed by me and considered in my medical decision making (see chart for details).  Clinical Course as of 12/23/20 1653  Tue Dec 23, 2020  1431 CT Abdomen Pelvis W Contrast [HS]    Clinical Course User Index [HS] Sherrill Raring, PA-C   MDM Rules/Calculators/A&P                           Mildly HTN, vitals stable. Nontoxic appearing. Epigastric tenderness with guarding - will CT due to age and acute onset. Suspect likely GERD/Gastritis given he has been off his medication recently.   CT abdomen negative for acute pathology. UA negative for UTI. Doubt pancreatis given low lipase. Pain improved in ED. Serial abdominal exams reassuring. Will discharge with PPI refill and have him follow up with GI out patients. Return precautions given. Discharged in stable condition.   Final Clinical Impression(s) / ED  Diagnoses Final diagnoses:  None    Rx / DC Orders ED Discharge Orders     None        Sherrill Raring, PA-C 12/23/20 1656    Daleen Bo, MD 12/24/20 1025

## 2020-12-25 ENCOUNTER — Encounter (INDEPENDENT_AMBULATORY_CARE_PROVIDER_SITE_OTHER): Payer: Self-pay | Admitting: Gastroenterology

## 2020-12-25 ENCOUNTER — Other Ambulatory Visit: Payer: Self-pay

## 2020-12-25 ENCOUNTER — Ambulatory Visit (INDEPENDENT_AMBULATORY_CARE_PROVIDER_SITE_OTHER): Payer: PPO | Admitting: Gastroenterology

## 2020-12-25 VITALS — BP 132/76 | HR 71 | Temp 98.2°F | Ht 66.0 in | Wt 182.0 lb

## 2020-12-25 DIAGNOSIS — K219 Gastro-esophageal reflux disease without esophagitis: Secondary | ICD-10-CM | POA: Diagnosis not present

## 2020-12-25 DIAGNOSIS — K2270 Barrett's esophagus without dysplasia: Secondary | ICD-10-CM | POA: Diagnosis not present

## 2020-12-25 MED ORDER — OMEPRAZOLE 20 MG PO CPDR: 20.0000 mg | DELAYED_RELEASE_CAPSULE | Freq: Every day | ORAL | 3 refills | Status: DC

## 2020-12-25 NOTE — Progress Notes (Signed)
Corey Zamora, M.D. Gastroenterology & Hepatology [facility] [address] [place], [place] [address]  Primary Care Physician: Asencion Noble, MD [address] [place] [address]  I will communicate my assessment and recommendations to the referring MD via EMR.  Problems: GERD Barrett's esophagus without dysplasia  History of Present Illness: Corey Zamora is a 80 y.o. male with past medical history of GERD, Barrett esophagus without dysplasia, arthritis, who presents for follow up of GERD and epigastric pain.  The patient was last seen on 03/07/2019. At that time, the patient Was continued on pantoprazole 40 mg 1 every day, he was scheduled for an EGD with findings described below.  Patient states he ran out of his medication a few months ago and reported having worsening epigastric pain and regurgitation with heartburn in early October. He states he was taking omeprazole 40 mg in the past compliantly.The patient came to the ER on 12/23/2020, underwent a CT scan without any acute finding explain his abdominal pain.  His CBC and CMP were otherwise unremarkable. After being discharged from the ER, he was sent home with a prescription of pantoprazole 20 mg every day.  Reported after he restarted medication he has been asymptomatic.  The patient denies having any nausea, vomiting, fever, chills, hematochezia, melena, hematemesis, abdominal distention, abdominal pain, diarrhea, jaundice, pruritus or weight loss.  CT abdomen and pelvis with IV contrast on 12/23/20 - Bilateral nonobstructive nephrolithiasis.  No acute abnormality seen in the abdomen or pelvis. Aortic Atherosclerosis .  Last EGD: 03/14/2019, irregular Z-line at 38 cm, 2 cm hiatal hernia, polyps throughout the stomach, normal duodenum. Path consistent with BE at [facility], also presence of fundic gland polyps neg for HP. Last Colonoscopy:  Past Medical History: Past  Medical History:  Diagnosis Date   Arthritis    GERD (gastroesophageal reflux disease)    History of kidney stones    Hx of cataract surgery 2009   Bilateral eyes   Kidney stones     Past Surgical History: Past Surgical History:  Procedure Laterality Date   APPENDECTOMY     BIOPSY  03/14/2019   Procedure: BIOPSY;  Surgeon: Rogene Houston, MD;  [facility];  Service: Endoscopy;;  gastric esophagus   COLONOSCOPY     COLONOSCOPY N/A 02/27/2016   Procedure: COLONOSCOPY;  Surgeon: Rogene Houston, MD;  [facility];  Service: Endoscopy;  Laterality: N/A;  [place] Left 08/04/2012   Procedure: CYSTOSCOPY WITH STENT PLACEMENT;  Surgeon: Hanley Ben, MD;  Location: [place] [facility];  Service: Urology;  Laterality: Left;   CYSTOSCOPY/RETROGRADE/URETEROSCOPY/STONE EXTRACTION WITH BASKET Left 08/04/2012   Procedure: CYSTOSCOPY/RETROGRADE/URETEROSCOPY/STONE EXTRACTION WITH BASKET;  Surgeon: Hanley Ben, MD;  Location: [place];  Service: Urology;  Laterality: Left;   ESOPHAGOGASTRODUODENOSCOPY N/A 02/27/2016   Procedure: ESOPHAGOGASTRODUODENOSCOPY (EGD);  Surgeon: Rogene Houston, MD;  [facility];  Service: Endoscopy;  Laterality: N/A;   ESOPHAGOGASTRODUODENOSCOPY N/A 03/14/2019   Procedure: ESOPHAGOGASTRODUODENOSCOPY (EGD);  Surgeon: Rogene Houston, MD;  [facility];  Service: Endoscopy;  Laterality: N/A;  2:50   ESOPHAGOGASTRODUODENOSCOPY (EGD) WITH ESOPHAGEAL DILATION N/A 04/19/2013   Procedure: ESOPHAGOGASTRODUODENOSCOPY (EGD) WITH ESOPHAGEAL DILATION;  Surgeon: Rogene Houston, MD;  [facility];  Service: Endoscopy;  Laterality: N/A;  200-moved to [address] notified pt   HOLMIUM LASER APPLICATION Left 2/99/2426   Procedure: HOLMIUM LASER APPLICATION;  Surgeon: Kirtland Bouchard  Nesi, MD;  Location: [place];  Service: Urology;  Laterality: Left;   Right hand  surgery     URETEROSCOPY WITH HOLMIUM LASER LITHOTRIPSY Bilateral 12/12/2017   Procedure: URETEROSCOPY WITH HOLMIUM LASER LITHOTRIPSY/ STENT PLACEMENT;  Surgeon: Kathie Rhodes, MD;  Location: [facility];  Service: Urology;  Laterality: Bilateral;    Family History: Family History  Problem Relation Age of Onset   Colon cancer Brother     Social History: Social History   Tobacco Use  Smoking Status Never  Smokeless Tobacco Current   Types: Chew  Tobacco Comments   chews tobacco   Social History   Substance and Sexual Activity  Alcohol Use No   Social History   Substance and Sexual Activity  Drug Use No    Allergies: Allergies  Allergen Reactions   Aspirin Hives   Penicillins Hives    Did it involve swelling of the face/tongue/throat, SOB, or low BP? No Did it involve sudden or severe rash/hives, skin peeling, or any reaction on the inside of your mouth or nose? No Did you need to seek medical attention at a hospital or doctor's office? No When did it last happen?      10 + years If all above answers are "NO", may proceed with cephalosporin use.    Codeine Nausea And Vomiting and Rash    Medications: Current Outpatient Medications  Medication Sig Dispense Refill   acetaminophen (TYLENOL) 500 MG tablet Take 500 mg by mouth every 8 (eight) hours as needed for moderate pain or headache.     alum & mag hydroxide-simeth (MYLANTA) 662-947-65 MG/5ML suspension Take 15 mLs by mouth every 6 (six) hours as needed for indigestion or heartburn. 355 mL 0   Multiple Vitamins-Minerals (MULTIVITAMIN WITH MINERALS) tablet Take 1 tablet by mouth 3 (three) times daily.     pantoprazole (PROTONIX) 20 MG tablet Take 1 tablet (20 mg total) by mouth daily. 60 tablet 0   Cholecalciferol (VITAMIN D) 50 MCG (2000 UT) tablet Take 4,000 Units by mouth daily. (Patient not taking: No sig reported)     diphenhydrAMINE (BENADRYL) 25 MG tablet Take 25 mg by mouth daily as needed for  allergies. (Patient not taking: No sig reported)     oxymetazoline (AFRIN) 0.05 % nasal spray Place 1 spray into both nostrils daily as needed for congestion. (Patient not taking: No sig reported)     No current facility-administered medications for this visit.    Review of Systems: GENERAL: negative for malaise, night sweats HEENT: No changes in hearing or vision, no nose bleeds or other nasal problems. NECK: Negative for lumps, goiter, pain and significant neck swelling RESPIRATORY: Negative for cough, wheezing CARDIOVASCULAR: Negative for chest pain, leg swelling, palpitations, orthopnea GI: SEE HPI MUSCULOSKELETAL: Negative for joint pain or swelling, back pain, and muscle pain. SKIN: Negative for lesions, rash PSYCH: Negative for sleep disturbance, mood disorder and recent psychosocial stressors. HEMATOLOGY Negative for prolonged bleeding, bruising easily, and swollen nodes. ENDOCRINE: Negative for cold or heat intolerance, polyuria, polydipsia and goiter. NEURO: negative for tremor, gait imbalance, syncope and seizures. The remainder of the review of systems is noncontributory.   Physical Exam: BP 132/76 (BP Location: Left Arm, Patient Position: Sitting, Cuff Size: Small)   Pulse 71   Temp 98.2 F (36.8 C) (Oral)   Ht 5\' 6"  (1.676 m)   Wt 182 lb (82.6 kg)   BMI 29.38 kg/m  GENERAL: The patient is AO x3, in no acute  distress. HEENT: Head is normocephalic and atraumatic. EOMI are intact. Mouth is well hydrated and without lesions. NECK: Supple. No masses LUNGS: Clear to auscultation. No presence of rhonchi/wheezing/rales. Adequate chest expansion HEART: RRR, normal s1 and s2. ABDOMEN: Soft, nontender, no guarding, no peritoneal signs, and nondistended. BS +. No masses. EXTREMITIES: Without any cyanosis, clubbing, rash, lesions or edema. NEUROLOGIC: AOx3, no focal motor deficit. SKIN: no jaundice, no rashes  Imaging/Labs: as above  I personally reviewed and interpreted  the available labs, imaging and endoscopic files.  Impression and Plan: Corey Zamora is a 80 y.o. male with past medical history of GERD, Barrett esophagus without dysplasia, arthritis, who presents for follow up of GERD and epigastric pain.  The patient presented recurrence of symptoms after he ran out of his PPI.  His symptoms have completely resolved after restarting low-dose pantoprazole, he had negative blood work-up and imaging studies recently.  We will continue low-dose PPI as he has been asymptomatic since then.  Patient was advised to call back if he had recurrence of symptoms upon which we will increase the dose of his PPI.  He will be due for repeat EGD in 3 years if he is medically fit.  He asked about performing a repeat colonoscopy which he will be due in December 2022 but he will think about it and give [place] a call back.  - Continue omeprazole 20 mg qday - Repeat EGD in 3 years if medically fit - Patient will call to let us know if interested in pursuing colonoscopy in 02/2021  All questions were answered.      Harvel Quale, MD Gastroenterology and Hepatology [facility]

## 2020-12-25 NOTE — Patient Instructions (Signed)
Continue omeprazole 20 mg qday Repeat EGD in 3 years if medically fit Patient will call to let us know if interested in pursuing colonoscopy in 02/2021

## 2020-12-26 ENCOUNTER — Telehealth (INDEPENDENT_AMBULATORY_CARE_PROVIDER_SITE_OTHER): Payer: Self-pay | Admitting: *Deleted

## 2020-12-26 ENCOUNTER — Telehealth (INDEPENDENT_AMBULATORY_CARE_PROVIDER_SITE_OTHER): Payer: Self-pay | Admitting: Gastroenterology

## 2020-12-26 MED ORDER — PANTOPRAZOLE SODIUM 20 MG PO TBEC: 20.0000 mg | DELAYED_RELEASE_TABLET | Freq: Every day | ORAL | 3 refills | Status: DC

## 2020-12-26 NOTE — Telephone Encounter (Signed)
Spoke with daughter, will send pantoprazole instead of omeprazole but I explained that we will try it with 20 mg dosing to decrease his dosage as he has been asymptomatic while taking this dosage recently.

## 2020-12-26 NOTE — Telephone Encounter (Signed)
Pt's daughter Lynelle Smoke called and states her dad was seen yesterday and he had been on protonix 40mg  one qam and they got a text from pharm that omeprazole was ready for pickup. She states her dad did not know why med was changed and that he also told her the doctor was going to cut him down from 40 to 20mg  but not sure why. Daughter Lynelle Smoke is not on release of information so I told her we could only talk with pt. She states he is hard of hearing. She is at work and not with him right now. He does have enough of protonix left for next week. I told her I would send dr Jenetta Downer a message and for him to continue med the same way he was taking until we clarify with dr as to what to take. She states she will have dad come by one day next week to add her to his release of information.   Daughter's number is [phone]

## 2020-12-26 NOTE — Telephone Encounter (Signed)
PPI sent

## 2021-01-07 DIAGNOSIS — Z23 Encounter for immunization: Secondary | ICD-10-CM | POA: Diagnosis not present

## 2021-02-16 DIAGNOSIS — H35361 Drusen (degenerative) of macula, right eye: Secondary | ICD-10-CM | POA: Diagnosis not present

## 2021-02-16 DIAGNOSIS — H40013 Open angle with borderline findings, low risk, bilateral: Secondary | ICD-10-CM | POA: Diagnosis not present

## 2021-02-16 DIAGNOSIS — H26493 Other secondary cataract, bilateral: Secondary | ICD-10-CM | POA: Diagnosis not present

## 2021-02-16 DIAGNOSIS — H26492 Other secondary cataract, left eye: Secondary | ICD-10-CM | POA: Diagnosis not present

## 2021-02-16 DIAGNOSIS — H43813 Vitreous degeneration, bilateral: Secondary | ICD-10-CM | POA: Diagnosis not present

## 2021-04-28 DIAGNOSIS — H47093 Other disorders of optic nerve, not elsewhere classified, bilateral: Secondary | ICD-10-CM | POA: Diagnosis not present

## 2021-09-29 DIAGNOSIS — I4510 Unspecified right bundle-branch block: Secondary | ICD-10-CM | POA: Diagnosis not present

## 2021-09-29 DIAGNOSIS — N1831 Chronic kidney disease, stage 3a: Secondary | ICD-10-CM | POA: Diagnosis not present

## 2021-09-29 DIAGNOSIS — Z79899 Other long term (current) drug therapy: Secondary | ICD-10-CM | POA: Diagnosis not present

## 2021-09-29 DIAGNOSIS — K219 Gastro-esophageal reflux disease without esophagitis: Secondary | ICD-10-CM | POA: Diagnosis not present

## 2021-09-29 DIAGNOSIS — R7301 Impaired fasting glucose: Secondary | ICD-10-CM | POA: Diagnosis not present

## 2021-10-05 DIAGNOSIS — I491 Atrial premature depolarization: Secondary | ICD-10-CM | POA: Diagnosis not present

## 2021-10-05 DIAGNOSIS — K219 Gastro-esophageal reflux disease without esophagitis: Secondary | ICD-10-CM | POA: Diagnosis not present

## 2021-10-05 DIAGNOSIS — Z6828 Body mass index (BMI) 28.0-28.9, adult: Secondary | ICD-10-CM | POA: Diagnosis not present

## 2021-10-05 DIAGNOSIS — E875 Hyperkalemia: Secondary | ICD-10-CM | POA: Diagnosis not present

## 2021-10-05 DIAGNOSIS — I700 Atherosclerosis of aorta: Secondary | ICD-10-CM | POA: Diagnosis not present

## 2021-10-05 DIAGNOSIS — N1832 Chronic kidney disease, stage 3b: Secondary | ICD-10-CM | POA: Diagnosis not present

## 2021-12-29 DIAGNOSIS — Z23 Encounter for immunization: Secondary | ICD-10-CM | POA: Diagnosis not present

## 2022-01-23 DIAGNOSIS — E663 Overweight: Secondary | ICD-10-CM | POA: Diagnosis not present

## 2022-01-23 DIAGNOSIS — U071 COVID-19: Secondary | ICD-10-CM | POA: Diagnosis not present

## 2022-01-23 DIAGNOSIS — Z6829 Body mass index (BMI) 29.0-29.9, adult: Secondary | ICD-10-CM | POA: Diagnosis not present

## 2022-02-18 DIAGNOSIS — C44519 Basal cell carcinoma of skin of other part of trunk: Secondary | ICD-10-CM | POA: Diagnosis not present

## 2022-02-18 DIAGNOSIS — D0359 Melanoma in situ of other part of trunk: Secondary | ICD-10-CM | POA: Diagnosis not present

## 2022-02-18 DIAGNOSIS — D0339 Melanoma in situ of other parts of face: Secondary | ICD-10-CM | POA: Diagnosis not present

## 2022-04-03 ENCOUNTER — Other Ambulatory Visit (INDEPENDENT_AMBULATORY_CARE_PROVIDER_SITE_OTHER): Payer: Self-pay | Admitting: Gastroenterology

## 2022-04-07 ENCOUNTER — Other Ambulatory Visit (INDEPENDENT_AMBULATORY_CARE_PROVIDER_SITE_OTHER): Payer: Self-pay | Admitting: Gastroenterology

## 2022-04-08 ENCOUNTER — Other Ambulatory Visit (INDEPENDENT_AMBULATORY_CARE_PROVIDER_SITE_OTHER): Payer: Self-pay

## 2022-04-08 MED ORDER — PANTOPRAZOLE SODIUM 20 MG PO TBEC: 20.0000 mg | DELAYED_RELEASE_TABLET | Freq: Every day | ORAL | 0 refills | Status: DC

## 2022-04-15 DIAGNOSIS — L905 Scar conditions and fibrosis of skin: Secondary | ICD-10-CM | POA: Diagnosis not present

## 2022-04-15 DIAGNOSIS — L989 Disorder of the skin and subcutaneous tissue, unspecified: Secondary | ICD-10-CM | POA: Diagnosis not present

## 2022-04-15 DIAGNOSIS — D030 Melanoma in situ of lip: Secondary | ICD-10-CM | POA: Diagnosis not present

## 2022-05-20 DIAGNOSIS — L738 Other specified follicular disorders: Secondary | ICD-10-CM | POA: Diagnosis not present

## 2022-05-20 DIAGNOSIS — L821 Other seborrheic keratosis: Secondary | ICD-10-CM | POA: Diagnosis not present

## 2022-05-20 DIAGNOSIS — L905 Scar conditions and fibrosis of skin: Secondary | ICD-10-CM | POA: Diagnosis not present

## 2022-05-25 ENCOUNTER — Ambulatory Visit (INDEPENDENT_AMBULATORY_CARE_PROVIDER_SITE_OTHER): Payer: PPO | Admitting: Gastroenterology

## 2022-05-25 ENCOUNTER — Encounter (INDEPENDENT_AMBULATORY_CARE_PROVIDER_SITE_OTHER): Payer: Self-pay | Admitting: Gastroenterology

## 2022-05-25 ENCOUNTER — Telehealth (INDEPENDENT_AMBULATORY_CARE_PROVIDER_SITE_OTHER): Payer: Self-pay | Admitting: *Deleted

## 2022-05-25 VITALS — Wt 180.0 lb

## 2022-05-25 DIAGNOSIS — K219 Gastro-esophageal reflux disease without esophagitis: Secondary | ICD-10-CM | POA: Diagnosis not present

## 2022-05-25 DIAGNOSIS — K2270 Barrett's esophagus without dysplasia: Secondary | ICD-10-CM | POA: Diagnosis not present

## 2022-05-25 MED ORDER — PANTOPRAZOLE SODIUM 20 MG PO TBEC: 20.0000 mg | DELAYED_RELEASE_TABLET | Freq: Every day | ORAL | 3 refills | Status: DC

## 2022-05-25 NOTE — Progress Notes (Signed)
Primary Care Physician:  Asencion Noble, MD  Primary GI: Jenetta Downer   Patient Location: Home   Provider Location: [facility] office   Reason for Visit: GERD follow up    Persons present on the virtual encounter, with roles: Corey Davidian L. Alver Sorrow, MSN, APRN, AGNP-C, Tyler Aas patient    Total time (minutes) spent on medical discussion:8 minutes  Virtual Visit via telephone visit is conducted virtually and was requested by patient.   I connected with Corey Zamora on 05/25/22 at  3:30 PM EST by telephone and verified that I am speaking with the correct person using two identifiers.   I discussed the limitations, risks, security and privacy concerns of performing an evaluation and management service by telephone and the availability of in person appointments. I also discussed with the patient that there may be a patient responsible charge related to this service. The patient expressed understanding and agreed to proceed.  Chief Complaint  Patient presents with   Gastroesophageal Reflux    Telephone visit today. Follow up on GERD. Doing well. Takes pantoprazole and reports no symptoms or issues with med.    History of Present Illness: Corey Zamora is a 82 y.o. male with past medical history of GERD, Barrett esophagus without dysplasia, arthritis, who presents for follow up of GERD   Last seen October 2022, advised to continue Omeprazole '20mg'$  daily, repeat EGD in 3 years if medically fit   Present: Maintained on protonix '20mg'$ . Doing well on this. Denies breakthrough symptoms. Appetite is good. No red flag symptoms. Patient denies melena, hematochezia, nausea, vomiting, diarrhea, constipation, dysphagia, odyonophagia, early satiety or weight loss. Patient does tell me his wife recently passed, has had a hard time with this but physically he is doing well.    Last EGD: 03/14/2019, irregular Z-line at 38 cm, 2 cm hiatal hernia, polyps throughout the stomach, normal duodenum. Path  consistent with BE at [facility], also presence of fundic gland polyps neg for HP   Past Medical History:  Diagnosis Date   Arthritis    GERD (gastroesophageal reflux disease)    History of kidney stones    Hx of cataract surgery 2009   Bilateral eyes   Kidney stones     Past Surgical History:  Procedure Laterality Date   APPENDECTOMY     BIOPSY  03/14/2019   Procedure: BIOPSY;  Surgeon: Rogene Houston, MD;  [facility];  Service: Endoscopy;;  gastric esophagus   COLONOSCOPY     COLONOSCOPY N/A 02/27/2016   Procedure: COLONOSCOPY;  Surgeon: Rogene Houston, MD;  [facility];  Service: Endoscopy;  Laterality: N/A;  [place] Left 08/04/2012   Procedure: CYSTOSCOPY WITH STENT PLACEMENT;  Surgeon: Hanley Ben, MD;  Location: [place];  Service: Urology;  Laterality: Left;   CYSTOSCOPY/RETROGRADE/URETEROSCOPY/STONE EXTRACTION WITH BASKET Left 08/04/2012   Procedure: CYSTOSCOPY/RETROGRADE/URETEROSCOPY/STONE EXTRACTION WITH BASKET;  Surgeon: Hanley Ben, MD;  Location: [place];  Service: Urology;  Laterality: Left;   ESOPHAGOGASTRODUODENOSCOPY N/A 02/27/2016   Procedure: ESOPHAGOGASTRODUODENOSCOPY (EGD);  Surgeon: Rogene Houston, MD;  [facility];  Service: Endoscopy;  Laterality: N/A;   ESOPHAGOGASTRODUODENOSCOPY N/A 03/14/2019   Procedure: ESOPHAGOGASTRODUODENOSCOPY (EGD);  Surgeon: Rogene Houston, MD;  [facility];  Service: Endoscopy;  Laterality: N/A;  2:50   ESOPHAGOGASTRODUODENOSCOPY (EGD) WITH ESOPHAGEAL DILATION N/A 04/19/2013   Procedure: ESOPHAGOGASTRODUODENOSCOPY (EGD) WITH ESOPHAGEAL DILATION;  Surgeon: Rogene Houston, MD;  [facility];  Service: Endoscopy;  Laterality: N/A;  200-moved to [place] notified pt   HOLMIUM LASER APPLICATION Left 0000000   Procedure: HOLMIUM LASER APPLICATION;  Surgeon: Hanley Ben, MD;  Location: [place];  Service: Urology;  Laterality: Left;   Right hand surgery     URETEROSCOPY WITH HOLMIUM LASER LITHOTRIPSY Bilateral 12/12/2017   Procedure: URETEROSCOPY WITH HOLMIUM LASER LITHOTRIPSY/ STENT PLACEMENT;  Surgeon: Kathie Rhodes, MD;  Location: [facility];  Service: Urology;  Laterality: Bilateral;     Current Meds  Medication Sig   Multiple Vitamins-Minerals (MULTIVITAMIN WITH MINERALS) tablet Take 1 tablet by mouth daily.   pantoprazole (PROTONIX) 20 MG tablet Take 1 tablet (20 mg total) by mouth daily.   [DISCONTINUED] acetaminophen (TYLENOL) 500 MG tablet Take 500 mg by mouth every 8 (eight) hours as needed for moderate pain or headache.     Family History  Problem Relation Age of Onset   Colon cancer Brother     Social History   Socioeconomic History   Marital status: Divorced    Spouse name: Not on file   Number of children: Not on file   Years of education: Not on file   Highest education level: Not on file  Occupational History   Not on file  Tobacco Use   Smoking status: Never    Passive exposure: Past   Smokeless tobacco: Former    Types: Chew   Tobacco comments:    chews tobacco  Substance and Sexual Activity   Alcohol use: No   Drug use: No   Sexual activity: Not on file  Other Topics Concern   Not on file  Social History Narrative   Not on file   Social Determinants of Health   Financial Resource Strain: Not on file  Food Insecurity: Not on file  Transportation Needs: Not on file  Physical Activity: Not on file  Stress: Not on file  Social Connections: Not on file   Review of Systems: Gen: Denies fever, chills, anorexia. Denies fatigue, weakness, weight loss.  CV: Denies chest pain, palpitations, syncope, peripheral edema, and claudication. Resp: Denies dyspnea at rest, cough, wheezing, coughing up blood, and pleurisy. GI: see HPI Derm: Denies rash, itching, dry skin Psych: Denies depression, anxiety, memory loss,  confusion. No homicidal or suicidal ideation.  Heme: Denies bruising, bleeding, and enlarged lymph nodes.  Observations/Objective: No distress. Unable to perform physical exam due to telephone encounter. No video available.   Assessment and Plan: Corey Zamora is a 82 y.o. male with past medical history of GERD, Barrett esophagus who presents today for follow up  GERD well managed on protonix '20mg'$  once daily. Denies breakthrough symptoms, dysphagia, odynophagia, nausea, vomiting, weight loss or changes in appetite. Will continue with current regimen. History of BE with last EGD in 2020, given he is in relatively good health, would recommend proceeding with updating EGD for BE surveillance. Indications, risks and benefits of procedure discussed in detail with patient. Patient verbalized understanding and is in agreement to proceed with EGD  Patient does not wish to pursue any further colonoscopy for screening purposes, at this time.   -continue protonix '20mg'$  daily -schedule EGD for BE surveillance ASA III   Follow Up Instructions: Follow up 1 year    I discussed the assessment and treatment plan with the patient. The patient was provided an opportunity to ask questions and all were answered. The patient agreed with the plan and demonstrated an  understanding of the instructions.   The patient was advised to call back or seek an in-person evaluation if the symptoms worsen or if the condition fails to improve as anticipated.  I provided 8  minutes of NON face-to-face time during this telephone.   Servando Kyllonen L. Alver Sorrow, MSN, APRN, AGNP-C Adult-Gerontology Nurse Practitioner [facility]  I have reviewed the note and agree with the APP's assessment as described in this progress note  Maylon Peppers, MD Gastroenterology and Hepatology [facility]

## 2022-05-25 NOTE — Patient Instructions (Signed)
Please continue protonix '20mg'$  once daily  Be mindful of greasy, spicy, fried, citrus foods, caffeine, carbonated drinks, chocolate and alcohol as these  can increase reflux symptoms Stay upright 2-3 hours after eating, prior to lying down and avoid eating late in the evenings.  You are due for EGD given history of barrett's esophagus, we will get you scheduled for this for surveillance.  Follow up 1 year   It was a pleasure to see you today. I want to create trusting relationships with patients and provide genuine, compassionate, and quality care. I truly value your feedback! please be on the lookout for a survey regarding your visit with me today. I appreciate your input about our visit and your time in completing this!    Corey Zamora L. Alver Sorrow, MSN, APRN, AGNP-C Adult-Gerontology Nurse Practitioner [facility] at [facility]

## 2022-05-26 ENCOUNTER — Telehealth (INDEPENDENT_AMBULATORY_CARE_PROVIDER_SITE_OTHER): Payer: Self-pay | Admitting: Gastroenterology

## 2022-05-26 NOTE — Telephone Encounter (Signed)
Pt daughter left voicemail.   Writer returned call to daughter. EGD scheduled for 06/29/22. Instructions mailed to patient. Will return call to daughter for pre op. Tammy ([phone]

## 2022-05-26 NOTE — Telephone Encounter (Signed)
error 

## 2022-05-26 NOTE — Telephone Encounter (Signed)
Left message for Tammy (pt daughter) to return call to schedule EGD ASA 3.

## 2022-05-31 NOTE — Telephone Encounter (Signed)
Pt daughter Lynelle Smoke contacted and made aware of in person pre op appt 06/25/22 at 11:00 AM

## 2022-06-21 ENCOUNTER — Telehealth (INDEPENDENT_AMBULATORY_CARE_PROVIDER_SITE_OTHER): Payer: Self-pay | Admitting: Gastroenterology

## 2022-06-21 NOTE — Telephone Encounter (Signed)
Pt daughter Tammy left voicemail in regards to Friday appt. Pt has pre op appt Friday at 11-pt daughter is needing to change that. Contacted Tammy and gave numbers to pre op.

## 2022-06-25 ENCOUNTER — Encounter (HOSPITAL_COMMUNITY)
Admission: RE | Admit: 2022-06-25 | Discharge: 2022-06-25 | Disposition: A | Discharge status: Home / Self Care | Payer: PPO | Source: Ambulatory Visit | Attending: Gastroenterology | Admitting: Gastroenterology

## 2022-06-29 ENCOUNTER — Ambulatory Visit (HOSPITAL_COMMUNITY): Payer: PPO | Admitting: Anesthesiology

## 2022-06-29 ENCOUNTER — Ambulatory Visit (HOSPITAL_COMMUNITY)
Admission: RE | Admit: 2022-06-29 | Discharge: 2022-06-29 | Disposition: A | Discharge status: Home / Self Care | Payer: PPO | Attending: Gastroenterology | Admitting: Gastroenterology

## 2022-06-29 ENCOUNTER — Encounter (HOSPITAL_COMMUNITY): Payer: Self-pay | Admitting: Gastroenterology

## 2022-06-29 ENCOUNTER — Encounter (HOSPITAL_COMMUNITY)
Admission: RE | Disposition: A | Discharge status: Home / Self Care | Payer: Self-pay | Source: Home / Self Care | Attending: Gastroenterology

## 2022-06-29 ENCOUNTER — Ambulatory Visit (HOSPITAL_BASED_OUTPATIENT_CLINIC_OR_DEPARTMENT_OTHER): Payer: PPO | Admitting: Anesthesiology

## 2022-06-29 DIAGNOSIS — K2270 Barrett's esophagus without dysplasia: Secondary | ICD-10-CM | POA: Diagnosis not present

## 2022-06-29 DIAGNOSIS — Z800 Family history of malignant neoplasm of digestive organs: Secondary | ICD-10-CM | POA: Insufficient documentation

## 2022-06-29 DIAGNOSIS — K5710 Diverticulosis of small intestine without perforation or abscess without bleeding: Secondary | ICD-10-CM | POA: Diagnosis not present

## 2022-06-29 DIAGNOSIS — Z87442 Personal history of urinary calculi: Secondary | ICD-10-CM | POA: Insufficient documentation

## 2022-06-29 DIAGNOSIS — K219 Gastro-esophageal reflux disease without esophagitis: Secondary | ICD-10-CM | POA: Diagnosis not present

## 2022-06-29 HISTORY — PX: BIOPSY: SHX5522

## 2022-06-29 HISTORY — PX: ESOPHAGOGASTRODUODENOSCOPY (EGD) WITH PROPOFOL: SHX5813

## 2022-06-29 SURGERY — ESOPHAGOGASTRODUODENOSCOPY (EGD) WITH PROPOFOL
Anesthesia: General

## 2022-06-29 MED ORDER — LACTATED RINGERS IV SOLN: INTRAVENOUS | Status: DC

## 2022-06-29 MED ORDER — PROPOFOL 10 MG/ML IV BOLUS
INTRAVENOUS | Status: DC | PRN
  Administered 2022-06-29: 30 mg via INTRAVENOUS
  Administered 2022-06-29: 20 mg via INTRAVENOUS
  Administered 2022-06-29: 40 mg via INTRAVENOUS
  Administered 2022-06-29: 70 mg via INTRAVENOUS
  Administered 2022-06-29: 20 mg via INTRAVENOUS

## 2022-06-29 MED ORDER — LIDOCAINE HCL 1 % IJ SOLN
INTRAMUSCULAR | Status: DC | PRN
  Administered 2022-06-29: 50 mg via INTRADERMAL

## 2022-06-29 NOTE — Transfer of Care (Signed)
Immediate Anesthesia Transfer of Care Note  Patient: Corey Zamora  Procedure(s) Performed: ESOPHAGOGASTRODUODENOSCOPY (EGD) WITH PROPOFOL BIOPSY  Patient Location: Short Stay  Anesthesia Type:General  Level of Consciousness: awake  Airway & Oxygen Therapy: Patient Spontanous Breathing  Post-op Assessment: Report given to RN  Post vital signs: Reviewed and stable  Last Vitals:  Vitals Value Taken Time  BP 94/51 06/29/22 0825  Temp 36.6 C 06/29/22 0825  Pulse 58 06/29/22 0825  Resp 15 06/29/22 0825  SpO2 95 % 06/29/22 0825    Last Pain:  Vitals:   06/29/22 0825  TempSrc: Axillary  PainSc: Asleep         Complications: No notable events documented.

## 2022-06-29 NOTE — Discharge Instructions (Signed)
You are being discharged to home.  ?Resume your previous diet.  ?We are waiting for your pathology results.  ?Continue your present medications.  ?

## 2022-06-29 NOTE — Op Note (Signed)
[facility] Patient Name: Corey Zamora Procedure Date: 06/29/2022 8:01 AM MRN: [id number] Date of Birth: 1941/02/25 Attending MD: Katrinka Blazing , , [id number] CSN: [id number] Age: 82 Admit Type: Outpatient Procedure:                Upper GI endoscopy Indications:              Follow-up of Barrett's esophagus Providers:                Katrinka Blazing, Sheran Fava, Burke Keels, Technician Referring MD:              Medicines:                Monitored Anesthesia Care Complications:            No immediate complications. Estimated Blood Loss:     Estimated blood loss: none. Procedure:                Pre-Anesthesia Assessment:                           - Prior to the procedure, a History and Physical                            was performed, and patient medications, allergies                            and sensitivities were reviewed. The patient's                            tolerance of previous anesthesia was reviewed.                           - The risks and benefits of the procedure and the                            sedation options and risks were discussed with the                            patient. All questions were answered and informed                            consent was obtained.                           - ASA Grade Assessment: II - A patient with mild                            systemic disease.                           After obtaining informed consent, the endoscope was                            passed under direct vision. Throughout the  procedure, the patient's blood pressure, pulse, and                            oxygen saturations were monitored continuously. The                            [id number]-H190 ([id number]) scope was introduced through the                            mouth, and advanced to the second part of duodenum.                            The upper GI endoscopy was accomplished without                             difficulty. The patient tolerated the procedure                            well. Scope In: 8:13:43 AM Scope Out: 8:20:03 AM Total Procedure Duration: 0 hours 6 minutes 20 seconds  Findings:      The esophagus and gastroesophageal junction were examined with white       light and narrow band imaging (NBI). Barrett's esophagus was present.       This involved the mucosa at the upper extent of the gastric folds (40 cm       from the incisors) extending to the Z-line (39 cm from the incisors).       One tongue of salmon-colored mucosa was present from 39 to 40 cm. The       maximum longitudinal extent of these esophageal mucosal changes was 1 cm       in length. This was biopsied with a cold forceps for histology.      The stomach was normal.      A medium non-bleeding diverticulum was found in the second portion of       the duodenum. Impression:               - Barrett's esophagus. Biopsied.                           - Normal stomach.                           - Non-bleeding duodenal diverticulum. Moderate Sedation:      Per Anesthesia Care Recommendation:           - Discharge patient to home (ambulatory).                           - Resume previous diet.                           - Await pathology results.                           - Continue present medications. Procedure Code(s):        --- Professional ---  [id number], Esophagogastroduodenoscopy, flexible,                            transoral; with biopsy, single or multiple Diagnosis Code(s):        --- Professional ---                           K22.70, Barrett's esophagus without dysplasia                           K57.10, Diverticulosis of small intestine without                            perforation or abscess without bleeding CPT copyright 2022 American Medical Association. All rights reserved. The codes documented in this report are preliminary and upon coder review may  be  revised to meet current compliance requirements. Katrinka Blazinganiel Castaneda, MD Katrinka Blazinganiel Castaneda,  06/29/2022 8:24:45 AM This report has been signed electronically. Number of Addenda: 0

## 2022-06-29 NOTE — Anesthesia Postprocedure Evaluation (Signed)
Anesthesia Post Note  Patient: Corey Zamora  Procedure(s) Performed: ESOPHAGOGASTRODUODENOSCOPY (EGD) WITH PROPOFOL BIOPSY  Patient location during evaluation: Short Stay Anesthesia Type: General Level of consciousness: awake Pain management: pain level controlled Vital Signs Assessment: post-procedure vital signs reviewed and stable Respiratory status: spontaneous breathing Cardiovascular status: blood pressure returned to baseline and stable Postop Assessment: no apparent nausea or vomiting Anesthetic complications: no   No notable events documented.   Last Vitals:  Vitals:   06/29/22 0751 06/29/22 0825  BP: (!) 148/83 (!) 94/51  Pulse: 60 (!) 58  Resp: 19 15  Temp: 36.6 C 36.6 C  SpO2: 98% 95%    Last Pain:  Vitals:   06/29/22 0825  TempSrc: Axillary  PainSc: Asleep                 Draven Laine

## 2022-06-29 NOTE — Anesthesia Preprocedure Evaluation (Signed)
Anesthesia Evaluation  Patient identified by MRN, date of birth, ID band Patient awake    Reviewed: Allergy & Precautions, H&P , NPO status , Patient's Chart, lab work & pertinent test results, reviewed documented beta blocker date and time   Airway Mallampati: II  TM Distance: >3 FB Neck ROM: full    Dental no notable dental hx.    Pulmonary neg pulmonary ROS   Pulmonary exam normal breath sounds clear to auscultation       Cardiovascular Exercise Tolerance: Good negative cardio ROS  Rhythm:regular Rate:Normal     Neuro/Psych negative neurological ROS  negative psych ROS   GI/Hepatic Neg liver ROS,GERD  Medicated,,  Endo/Other  negative endocrine ROS    Renal/GU negative Renal ROS  negative genitourinary   Musculoskeletal   Abdominal   Peds  Hematology negative hematology ROS (+)   Anesthesia Other Findings   Reproductive/Obstetrics negative OB ROS                             Anesthesia Physical Anesthesia Plan  ASA: 2  Anesthesia Plan: General   Post-op Pain Management:    Induction:   PONV Risk Score and Plan: Propofol infusion  Airway Management Planned:   Additional Equipment:   Intra-op Plan:   Post-operative Plan:   Informed Consent: I have reviewed the patients History and Physical, chart, labs and discussed the procedure including the risks, benefits and alternatives for the proposed anesthesia with the patient or authorized representative who has indicated his/her understanding and acceptance.     Dental Advisory Given  Plan Discussed with: CRNA  Anesthesia Plan Comments:        Anesthesia Quick Evaluation  

## 2022-06-29 NOTE — H&P (Signed)
Corey Zamora is an 82 y.o. male.   Chief Complaint: Barrett's esophagus HPI: 82 year old male with past medical history of kidney stones, GERD complicated by Barrett's esophagus, coming for surveillance of Barrett's esophagus.  The patient denies having any nausea, vomiting, fever, chills, hematochezia, melena, hematemesis, abdominal distention, abdominal pain, diarrhea, jaundice, pruritus or weight loss.   Past Medical History:  Diagnosis Date   Arthritis    GERD (gastroesophageal reflux disease)    History of kidney stones    Hx of cataract surgery 2009   Bilateral eyes   Kidney stones     Past Surgical History:  Procedure Laterality Date   APPENDECTOMY     BIOPSY  03/14/2019   Procedure: BIOPSY;  Surgeon: [name], MD;  [facility];  Service: Endoscopy;;  gastric esophagus   COLONOSCOPY     COLONOSCOPY N/A 02/27/2016   Procedure: COLONOSCOPY;  Surgeon: [name], MD;  [facility];  Service: Endoscopy;  Laterality: N/A;  730   CYSTOSCOPY WITH STENT PLACEMENT Left 08/04/2012   Procedure: CYSTOSCOPY WITH STENT PLACEMENT;  Surgeon: Lindaann Slough, MD;  Location: [facility];  Service: Urology;  Laterality: Left;   CYSTOSCOPY/RETROGRADE/URETEROSCOPY/STONE EXTRACTION WITH BASKET Left 08/04/2012   Procedure: CYSTOSCOPY/RETROGRADE/URETEROSCOPY/STONE EXTRACTION WITH BASKET;  Surgeon: Lindaann Slough, MD;  Location: [facility];  Service: Urology;  Laterality: Left;   ESOPHAGOGASTRODUODENOSCOPY N/A 02/27/2016   Procedure: ESOPHAGOGASTRODUODENOSCOPY (EGD);  Surgeon: [name], MD;  [facility];  Service: Endoscopy;  Laterality: N/A;   ESOPHAGOGASTRODUODENOSCOPY N/A 03/14/2019   Procedure: ESOPHAGOGASTRODUODENOSCOPY (EGD);  Surgeon: [name], MD;  [facility];  Service: Endoscopy;  Laterality: N/A;  2:50   ESOPHAGOGASTRODUODENOSCOPY (EGD) WITH ESOPHAGEAL DILATION N/A 04/19/2013    Procedure: ESOPHAGOGASTRODUODENOSCOPY (EGD) WITH ESOPHAGEAL DILATION;  Surgeon: [name], MD;  [facility];  Service: Endoscopy;  Laterality: N/A;  200-moved to 730 Ann notified pt   HOLMIUM LASER APPLICATION Left 08/04/2012   Procedure: HOLMIUM LASER APPLICATION;  Surgeon: Lindaann Slough, MD;  Location: [facility];  Service: Urology;  Laterality: Left;   Right hand surgery     URETEROSCOPY WITH HOLMIUM LASER LITHOTRIPSY Bilateral 12/12/2017   Procedure: URETEROSCOPY WITH HOLMIUM LASER LITHOTRIPSY/ STENT PLACEMENT;  Surgeon: Ihor Gully, MD;  Location: [facility];  Service: Urology;  Laterality: Bilateral;    Family History  Problem Relation Age of Onset   Colon cancer Brother    Social History:  reports that he has never smoked. He has been exposed to tobacco smoke. He has quit using smokeless tobacco.  His smokeless tobacco use included chew. He reports that he does not drink alcohol and does not use drugs.  Allergies:  Allergies  Allergen Reactions   Aspirin Hives   Penicillins Hives    Did it involve swelling of the face/tongue/throat, SOB, or low BP? No Did it involve sudden or severe rash/hives, skin peeling, or any reaction on the inside of your mouth or nose? No Did you need to seek medical attention at a hospital or doctor's office? No When did it last happen?      10 + years If all above answers are "NO", may proceed with cephalosporin use.    Codeine Nausea And Vomiting and Rash    Medications Prior to Admission  Medication Sig Dispense Refill   Multiple Vitamins-Minerals (MULTIVITAMIN WITH MINERALS) tablet Take 1 tablet by mouth daily.     pantoprazole (PROTONIX)  20 MG tablet Take 1 tablet (20 mg total) by mouth daily. 90 tablet 3    No results found for this or any previous visit (from the past 48 hour(s)). No results found.  Review of Systems  All other systems reviewed and are negative.   Blood pressure (!)  148/83, pulse 60, temperature 97.8 F (36.6 C), temperature source Oral, resp. rate 19, SpO2 98 %. Physical Exam  GENERAL: The patient is AO x3, in no acute distress. HEENT: Head is normocephalic and atraumatic. EOMI are intact. Mouth is well hydrated and without lesions. NECK: Supple. No masses LUNGS: Clear to auscultation. No presence of rhonchi/wheezing/rales. Adequate chest expansion HEART: RRR, normal s1 and s2. ABDOMEN: Soft, nontender, no guarding, no peritoneal signs, and nondistended. BS +. No masses. EXTREMITIES: Without any cyanosis, clubbing, rash, lesions or edema. NEUROLOGIC: AOx3, no focal motor deficit. SKIN: no jaundice, no rashes  Assessment/Plan  82 year old male with past medical history of kidney stones, GERD complicated by Barrett's esophagus, coming for surveillance of Barrett's esophagus.  Will proceed with EGD.  Dolores Frame, MD 06/29/2022, 8:03 AM

## 2022-06-30 LAB — SURGICAL PATHOLOGY

## 2022-07-06 ENCOUNTER — Encounter (HOSPITAL_COMMUNITY): Payer: Self-pay | Admitting: Gastroenterology

## 2022-09-30 DIAGNOSIS — Z79899 Other long term (current) drug therapy: Secondary | ICD-10-CM | POA: Diagnosis not present

## 2022-09-30 DIAGNOSIS — N1832 Chronic kidney disease, stage 3b: Secondary | ICD-10-CM | POA: Diagnosis not present

## 2022-09-30 DIAGNOSIS — I4510 Unspecified right bundle-branch block: Secondary | ICD-10-CM | POA: Diagnosis not present

## 2022-09-30 DIAGNOSIS — K2270 Barrett's esophagus without dysplasia: Secondary | ICD-10-CM | POA: Diagnosis not present

## 2022-09-30 DIAGNOSIS — R7301 Impaired fasting glucose: Secondary | ICD-10-CM | POA: Diagnosis not present

## 2022-09-30 DIAGNOSIS — I700 Atherosclerosis of aorta: Secondary | ICD-10-CM | POA: Diagnosis not present

## 2022-10-07 DIAGNOSIS — I4510 Unspecified right bundle-branch block: Secondary | ICD-10-CM | POA: Diagnosis not present

## 2022-10-07 DIAGNOSIS — D696 Thrombocytopenia, unspecified: Secondary | ICD-10-CM | POA: Diagnosis not present

## 2022-10-07 DIAGNOSIS — N1831 Chronic kidney disease, stage 3a: Secondary | ICD-10-CM | POA: Diagnosis not present

## 2022-10-07 DIAGNOSIS — K219 Gastro-esophageal reflux disease without esophagitis: Secondary | ICD-10-CM | POA: Diagnosis not present

## 2022-10-07 DIAGNOSIS — Z0001 Encounter for general adult medical examination with abnormal findings: Secondary | ICD-10-CM | POA: Diagnosis not present

## 2023-01-04 DIAGNOSIS — Z23 Encounter for immunization: Secondary | ICD-10-CM | POA: Diagnosis not present

## 2023-02-03 DIAGNOSIS — E663 Overweight: Secondary | ICD-10-CM | POA: Diagnosis not present

## 2023-02-03 DIAGNOSIS — R059 Cough, unspecified: Secondary | ICD-10-CM | POA: Diagnosis not present

## 2023-02-03 DIAGNOSIS — U071 COVID-19: Secondary | ICD-10-CM | POA: Diagnosis not present

## 2023-02-03 DIAGNOSIS — J101 Influenza due to other identified influenza virus with other respiratory manifestations: Secondary | ICD-10-CM | POA: Diagnosis not present

## 2023-02-03 DIAGNOSIS — R509 Fever, unspecified: Secondary | ICD-10-CM | POA: Diagnosis not present

## 2023-02-03 DIAGNOSIS — Z6828 Body mass index (BMI) 28.0-28.9, adult: Secondary | ICD-10-CM | POA: Diagnosis not present

## 2023-02-03 DIAGNOSIS — J301 Allergic rhinitis due to pollen: Secondary | ICD-10-CM | POA: Diagnosis not present

## 2023-02-03 DIAGNOSIS — J22 Unspecified acute lower respiratory infection: Secondary | ICD-10-CM | POA: Diagnosis not present

## 2023-05-05 DIAGNOSIS — H524 Presbyopia: Secondary | ICD-10-CM | POA: Diagnosis not present

## 2023-05-05 DIAGNOSIS — H43813 Vitreous degeneration, bilateral: Secondary | ICD-10-CM | POA: Diagnosis not present

## 2023-05-30 ENCOUNTER — Ambulatory Visit (INDEPENDENT_AMBULATORY_CARE_PROVIDER_SITE_OTHER): Payer: PPO | Admitting: Gastroenterology

## 2023-05-30 ENCOUNTER — Encounter (INDEPENDENT_AMBULATORY_CARE_PROVIDER_SITE_OTHER): Payer: Self-pay | Admitting: Gastroenterology

## 2023-05-30 VITALS — BP 132/79 | HR 61 | Temp 98.0°F | Ht 66.0 in | Wt 184.8 lb

## 2023-05-30 DIAGNOSIS — K219 Gastro-esophageal reflux disease without esophagitis: Secondary | ICD-10-CM

## 2023-05-30 NOTE — Patient Instructions (Addendum)
 Continue pantoprazole 20 mg every day Ask Dr. Ouida Sills if he can keep refilling your medication

## 2023-05-30 NOTE — Progress Notes (Signed)
 Katrinka Blazing, M.D. Gastroenterology & Hepatology [facility] [address] [place], [place] [address]  Primary Care Physician: Carylon Perches, MD [address] [place] [address]  I will communicate my assessment and recommendations to the referring MD via EMR.  Problems: GERD  History of Present Illness: Corey Zamora is a 83 y.o. male with past medical history of GERD, previously history of Barrett esophagus without dysplasia, arthritis  who presents for follow up of GERD.  The patient was last seen on 05/25/2022. At that time, the patient was advised to continue Protonix 20 mg daily and was scheduled for EGD.  Denies any complaints.  Reports feeling well while taking Protonix 20 mg daily.  The patient denies having any dysphagia, odynophagia, heartburn, nausea, vomiting, fever, chills, hematochezia, melena, hematemesis, abdominal distention, abdominal pain, diarrhea, jaundice, pruritus or weight loss.  Last EGD: 06/29/2022, 1 cm salmon colored tongue with normal abscess, normal stomach, duodenal diverticulum. Given patient's age and absence of Barrett's and biopsies, surveillance EGD was not recommended  Past Medical History: Past Medical History:  Diagnosis Date   Arthritis    GERD (gastroesophageal reflux disease)    History of kidney stones    Hx of cataract surgery 2009   Bilateral eyes   Kidney stones     Past Surgical History: Past Surgical History:  Procedure Laterality Date   APPENDECTOMY     BIOPSY  03/14/2019   Procedure: BIOPSY;  Surgeon: [name], MD;  [facility];  Service: Endoscopy;;  gastric esophagus   BIOPSY  06/29/2022   Procedure: BIOPSY;  Surgeon: Dolores Frame, MD;  [facility];  Service: Gastroenterology;;   COLONOSCOPY     COLONOSCOPY N/A 02/27/2016   Procedure: COLONOSCOPY;  Surgeon: [name], MD;  [facility];  Service: Endoscopy;   Laterality: N/A;  730   CYSTOSCOPY WITH STENT PLACEMENT Left 08/04/2012   Procedure: CYSTOSCOPY WITH STENT PLACEMENT;  Surgeon: Lindaann Slough, MD;  Location: [facility] [place];  Service: Urology;  Laterality: Left;   CYSTOSCOPY/RETROGRADE/URETEROSCOPY/STONE EXTRACTION WITH BASKET Left 08/04/2012   Procedure: CYSTOSCOPY/RETROGRADE/URETEROSCOPY/STONE EXTRACTION WITH BASKET;  Surgeon: Lindaann Slough, MD;  Location: [facility];  Service: Urology;  Laterality: Left;   ESOPHAGOGASTRODUODENOSCOPY N/A 02/27/2016   Procedure: ESOPHAGOGASTRODUODENOSCOPY (EGD);  Surgeon: [name], MD;  [facility];  Service: Endoscopy;  Laterality: N/A;   ESOPHAGOGASTRODUODENOSCOPY N/A 03/14/2019   Procedure: ESOPHAGOGASTRODUODENOSCOPY (EGD);  Surgeon: [name], MD;  [facility];  Service: Endoscopy;  Laterality: N/A;  2:50   ESOPHAGOGASTRODUODENOSCOPY (EGD) WITH ESOPHAGEAL DILATION N/A 04/19/2013   Procedure: ESOPHAGOGASTRODUODENOSCOPY (EGD) WITH ESOPHAGEAL DILATION;  Surgeon: [name], MD;  [facility];  Service: Endoscopy;  Laterality: N/A;  200-moved to 730 Ann notified pt   ESOPHAGOGASTRODUODENOSCOPY (EGD) WITH PROPOFOL N/A 06/29/2022   Procedure: ESOPHAGOGASTRODUODENOSCOPY (EGD) WITH PROPOFOL;  Surgeon: Dolores Frame, MD;  [facility];  Service: Gastroenterology;  Laterality: N/A;  800am, asa 3   HOLMIUM LASER APPLICATION Left 08/04/2012   Procedure: HOLMIUM LASER APPLICATION;  Surgeon: Lindaann Slough, MD;  Location: [facility];  Service: Urology;  Laterality: Left;   Right hand surgery     URETEROSCOPY WITH HOLMIUM LASER LITHOTRIPSY Bilateral 12/12/2017   Procedure: URETEROSCOPY WITH HOLMIUM LASER LITHOTRIPSY/ STENT PLACEMENT;  Surgeon: Ihor Gully, MD;  Location: [facility];  Service: Urology;  Laterality: Bilateral;    Family  History: Family History  Problem Relation Age of  Onset   Colon cancer Brother     Social History: Social History   Tobacco Use  Smoking Status Never   Passive exposure: Past  Smokeless Tobacco Current   Types: Chew  Tobacco Comments   chews tobacco   Social History   Substance and Sexual Activity  Alcohol Use No   Social History   Substance and Sexual Activity  Drug Use No    Allergies: Allergies  Allergen Reactions   Aspirin Hives   Penicillins Hives    Did it involve swelling of the face/tongue/throat, SOB, or low BP? No Did it involve sudden or severe rash/hives, skin peeling, or any reaction on the inside of your mouth or nose? No Did you need to seek medical attention at a hospital or doctor's office? No When did it last happen?      10 + years If all above answers are "NO", may proceed with cephalosporin use.    Codeine Nausea And Vomiting and Rash    Medications: Current Outpatient Medications  Medication Sig Dispense Refill   Multiple Vitamins-Minerals (MULTIVITAMIN WITH MINERALS) tablet Take 1 tablet by mouth daily.     OVER THE COUNTER MEDICATION Vit B daily  Vit D3  daily     pantoprazole (PROTONIX) 20 MG tablet Take 1 tablet (20 mg total) by mouth daily. 90 tablet 3   No current facility-administered medications for this visit.    Review of Systems: GENERAL: negative for malaise, night sweats HEENT: No changes in hearing or vision, no nose bleeds or other nasal problems. NECK: Negative for lumps, goiter, pain and significant neck swelling RESPIRATORY: Negative for cough, wheezing CARDIOVASCULAR: Negative for chest pain, leg swelling, palpitations, orthopnea GI: SEE HPI MUSCULOSKELETAL: Negative for joint pain or swelling, back pain, and muscle pain. SKIN: Negative for lesions, rash PSYCH: Negative for sleep disturbance, mood disorder and recent psychosocial stressors. HEMATOLOGY Negative for prolonged bleeding, bruising easily, and swollen nodes. ENDOCRINE: Negative for cold or heat  intolerance, polyuria, polydipsia and goiter. NEURO: negative for tremor, gait imbalance, syncope and seizures. The remainder of the review of systems is noncontributory.   Physical Exam: BP 132/79 (BP Location: Right Arm, Patient Position: Sitting, Cuff Size: Normal)   Pulse 61   Temp 98 F (36.7 C) (Temporal)   Ht 5\' 6"  (1.676 m)   Wt 184 lb 12.8 oz (83.8 kg)   BMI 29.83 kg/m  GENERAL: The patient is AO x3, in no acute distress. HEENT: Head is normocephalic and atraumatic. EOMI are intact. Mouth is well hydrated and without lesions. NECK: Supple. No masses LUNGS: Clear to auscultation. No presence of rhonchi/wheezing/rales. Adequate chest expansion HEART: RRR, normal s1 and s2. ABDOMEN: Soft, nontender, no guarding, no peritoneal signs, and nondistended. BS +. No masses. EXTREMITIES: Without any cyanosis, clubbing, rash, lesions or edema. NEUROLOGIC: AOx3, no focal motor deficit. SKIN: no jaundice, no rashes  Imaging/Labs: as above  I personally reviewed and interpreted the available labs, imaging and endoscopic files.  Impression and Plan: Corey Zamora is a 83 y.o. male with past medical history of GERD, previously history of Barrett esophagus without dysplasia, arthritis  who presents for follow up of GERD.  Patient has been doing well while taking pantoprazole daily, has had adequate control of GERD without any red flag signs.  Most recent EGD did not show any presence of Barrett's esophagus.  It is possible this has regressed while on acid suppression therapy.  We  discussed that he should continue this indefinitely and he can ask his PCP for further refills.  Patient agrees with this plan.  -Continue pantoprazole 20 mg every day indefinitely  All questions were answered.      Katrinka Blazing, MD Gastroenterology and Hepatology [facility]

## 2023-06-09 ENCOUNTER — Ambulatory Visit
Admission: RE | Admit: 2023-06-09 | Discharge: 2023-06-09 | Disposition: A | Discharge status: Home / Self Care | Payer: Self-pay | Source: Ambulatory Visit | Attending: Internal Medicine | Admitting: Internal Medicine

## 2023-06-09 VITALS — BP 145/84 | HR 75 | Temp 98.1°F | Resp 18

## 2023-06-09 DIAGNOSIS — H9193 Unspecified hearing loss, bilateral: Secondary | ICD-10-CM

## 2023-06-09 DIAGNOSIS — H938X3 Other specified disorders of ear, bilateral: Secondary | ICD-10-CM

## 2023-06-09 NOTE — ED Provider Notes (Signed)
 [facility]    CSN: [id number] Arrival date & time: 06/09/23  1109      History   Chief Complaint Chief Complaint  Patient presents with   Ear Fullness    I would like my ears cleaned out. - Entered by patient    HPI Corey Zamora is a 83 y.o. male.   The history is provided by the patient.   Patient presents for complaints of bilateral ear fullness, and difficulty hearing.  Patient states he would like his ears "cleaned out."  Patient denies ear pain, ear drainage, or recent URI symptoms.  Patient states that he cleans his ears with Q-tips.  Past Medical History:  Diagnosis Date   Arthritis    GERD (gastroesophageal reflux disease)    History of kidney stones    Hx of cataract surgery 2009   Bilateral eyes   Kidney stones     Patient Active Problem List   Diagnosis Date Noted   GERD (gastroesophageal reflux disease) 12/25/2020   Epigastric abdominal tenderness 02/23/2018   Hx of colonic polyps 01/21/2016   Family history of colon cancer 01/21/2016   Barrett's esophagus without dysplasia 01/21/2016   Colonic adenoma 03/08/2012    Past Surgical History:  Procedure Laterality Date   APPENDECTOMY     BIOPSY  03/14/2019   Procedure: BIOPSY;  Surgeon: [name], MD;  Location: [facility];  Service: Endoscopy;;  gastric esophagus   BIOPSY  06/29/2022   Procedure: BIOPSY;  Surgeon: Dolores Frame, MD;  Location: [facility];  Service: Gastroenterology;;   COLONOSCOPY     COLONOSCOPY N/A 02/27/2016   Procedure: COLONOSCOPY;  Surgeon: [name], MD;  Location: [facility];  Service: Endoscopy;  Laterality: N/A;  730   CYSTOSCOPY WITH STENT PLACEMENT Left 08/04/2012   Procedure: CYSTOSCOPY WITH STENT PLACEMENT;  Surgeon: Lindaann Slough, MD;  Location: [facility] [place];  Service: Urology;  Laterality: Left;   CYSTOSCOPY/RETROGRADE/URETEROSCOPY/STONE EXTRACTION WITH BASKET Left 08/04/2012   Procedure:  CYSTOSCOPY/RETROGRADE/URETEROSCOPY/STONE EXTRACTION WITH BASKET;  Surgeon: Lindaann Slough, MD;  Location: [facility] [place];  Service: Urology;  Laterality: Left;   ESOPHAGOGASTRODUODENOSCOPY N/A 02/27/2016   Procedure: ESOPHAGOGASTRODUODENOSCOPY (EGD);  Surgeon: [name], MD;  [facility]: [facility];  Service: Endoscopy;  Laterality: N/A;   ESOPHAGOGASTRODUODENOSCOPY N/A 03/14/2019   Procedure: ESOPHAGOGASTRODUODENOSCOPY (EGD);  Surgeon: [name], MD;  [facility];  Service: Endoscopy;  Laterality: N/A;  2:50   ESOPHAGOGASTRODUODENOSCOPY (EGD) WITH ESOPHAGEAL DILATION N/A 04/19/2013   Procedure: ESOPHAGOGASTRODUODENOSCOPY (EGD) WITH ESOPHAGEAL DILATION;  Surgeon: [name], MD;  [facility]: [facility];  Service: Endoscopy;  Laterality: N/A;  200-moved to 730 Ann notified pt   ESOPHAGOGASTRODUODENOSCOPY (EGD) WITH PROPOFOL N/A 06/29/2022   Procedure: ESOPHAGOGASTRODUODENOSCOPY (EGD) WITH PROPOFOL;  Surgeon: Dolores Frame, MD;  [facility]: [facility];  Service: Gastroenterology;  Laterality: N/A;  800am, asa 3   HOLMIUM LASER APPLICATION Left 08/04/2012   Procedure: HOLMIUM LASER APPLICATION;  Surgeon: Lindaann Slough, MD;  Location: [facility] [place];  Service: Urology;  Laterality: Left;   Right hand surgery     URETEROSCOPY WITH HOLMIUM LASER LITHOTRIPSY Bilateral 12/12/2017   Procedure: URETEROSCOPY WITH HOLMIUM LASER LITHOTRIPSY/ STENT PLACEMENT;  Surgeon: Ihor Gully, MD;  Location: [facility];  Service: Urology;  Laterality: Bilateral;       Home Medications    Prior to Admission medications   Medication Sig Start Date End Date Taking? Authorizing  Provider  Multiple Vitamins-Minerals (MULTIVITAMIN WITH MINERALS) tablet Take 1 tablet by mouth daily.    [provider]  OVER THE COUNTER MEDICATION Vit B daily  Vit D3  daily    [provider]  pantoprazole (PROTONIX) 20 MG tablet Take 1  tablet (20 mg total) by mouth daily. 05/25/22   Raquel James, NP    Family History Family History  Problem Relation Age of Onset   Colon cancer Brother     Social History Social History   Tobacco Use   Smoking status: Never    Passive exposure: Past   Smokeless tobacco: Current    Types: Chew   Tobacco comments:    chews tobacco  Vaping Use   Vaping status: Never Used  Substance Use Topics   Alcohol use: No   Drug use: No     Allergies   Aspirin, Penicillins, and Codeine   Review of Systems Review of Systems Per HPI  Physical Exam Triage Vital Signs ED Triage Vitals  Encounter Vitals Group     BP 06/09/23 1117 (!) 145/84     Systolic BP Percentile --      Diastolic BP Percentile --      Pulse Rate 06/09/23 1117 75     Resp 06/09/23 1117 18     Temp 06/09/23 1117 98.1 F (36.7 C)     Temp Source 06/09/23 1117 Oral     SpO2 06/09/23 1117 94 %     Weight --      Height --      Head Circumference --      Peak Flow --      Pain Score 06/09/23 1119 0     Pain Loc --      Pain Education --      Exclude from Growth Chart --    No data found.  Updated Vital Signs BP (!) 145/84 (BP Location: Right Arm)   Pulse 75   Temp 98.1 F (36.7 C) (Oral)   Resp 18   SpO2 94%   Visual Acuity Right Eye Distance:   Left Eye Distance:   Bilateral Distance:    Right Eye Near:   Left Eye Near:    Bilateral Near:     Physical Exam Vitals and nursing note reviewed.  Constitutional:      General: He is not in acute distress.    Appearance: Normal appearance.  HENT:     Head: Normocephalic.     Right Ear: Tympanic membrane, ear canal and external ear normal.     Left Ear: Tympanic membrane, ear canal and external ear normal.     Mouth/Throat:     Mouth: Mucous membranes are moist.  Eyes:     Extraocular Movements: Extraocular movements intact.     Conjunctiva/sclera: Conjunctivae normal.     Pupils: Pupils are equal, round, and reactive to light.   Pulmonary:     Effort: Pulmonary effort is normal.  Musculoskeletal:     Cervical back: Normal range of motion.  Skin:    General: Skin is warm and dry.  Neurological:     General: No focal deficit present.     Mental Status: He is alert and oriented to person, place, and time.  Psychiatric:        Mood and Affect: Mood normal.        Behavior: Behavior normal.      UC Treatments / Results  Labs (all labs ordered are listed, but  only abnormal results are displayed) Labs Reviewed - No data to display  EKG   Radiology No results found.  Procedures Procedures (including critical care time)  Medications Ordered in UC Medications - No data to display  Initial Impression / Assessment and Plan / UC Course  I have reviewed the triage vital signs and the nursing notes.  Pertinent labs & imaging results that were available during my care of the patient were reviewed by me and considered in my medical decision making (see chart for details).  On exam, patient without cerumen impaction bilaterally, TMs are within normal limits.  Patient with mild bilateral middle ear effusions.  Patient advised to start over-the-counter antihistamines.  Supportive care recommendations were provided and discussed with the patient to include fluids, over-the-counter analgesics for pain, and warm compresses to the ears as needed.  Discussed indications for follow-up.  Patient was in agreement with this plan of care and verbalized understanding.  All questions were answered.  Patient stable for discharge.  Final Clinical Impressions(s) / UC Diagnoses   Final diagnoses:  None   Discharge Instructions   None    ED Prescriptions   None    PDMP not reviewed this encounter.   Abran Cantor, NP 06/09/23 1152

## 2023-06-09 NOTE — Discharge Instructions (Addendum)
Patient declined AVS 

## 2023-06-09 NOTE — ED Triage Notes (Signed)
 Pt reports bilateral ear fullness, difficulty  hearing states he would like to have his ears cleaned out.

## 2023-07-20 ENCOUNTER — Other Ambulatory Visit (INDEPENDENT_AMBULATORY_CARE_PROVIDER_SITE_OTHER): Payer: Self-pay | Admitting: Gastroenterology

## 2023-08-14 DIAGNOSIS — R030 Elevated blood-pressure reading, without diagnosis of hypertension: Secondary | ICD-10-CM | POA: Diagnosis not present

## 2023-08-14 DIAGNOSIS — T148XXA Other injury of unspecified body region, initial encounter: Secondary | ICD-10-CM | POA: Diagnosis not present

## 2023-08-14 DIAGNOSIS — E663 Overweight: Secondary | ICD-10-CM | POA: Diagnosis not present

## 2023-08-14 DIAGNOSIS — Z6829 Body mass index (BMI) 29.0-29.9, adult: Secondary | ICD-10-CM | POA: Diagnosis not present

## 2023-09-30 DIAGNOSIS — N200 Calculus of kidney: Secondary | ICD-10-CM | POA: Diagnosis not present

## 2023-09-30 DIAGNOSIS — K219 Gastro-esophageal reflux disease without esophagitis: Secondary | ICD-10-CM | POA: Diagnosis not present

## 2023-09-30 DIAGNOSIS — D696 Thrombocytopenia, unspecified: Secondary | ICD-10-CM | POA: Diagnosis not present

## 2023-09-30 DIAGNOSIS — N1831 Chronic kidney disease, stage 3a: Secondary | ICD-10-CM | POA: Diagnosis not present

## 2023-09-30 DIAGNOSIS — Z79899 Other long term (current) drug therapy: Secondary | ICD-10-CM | POA: Diagnosis not present

## 2023-10-10 DIAGNOSIS — N1831 Chronic kidney disease, stage 3a: Secondary | ICD-10-CM | POA: Diagnosis not present

## 2023-10-10 DIAGNOSIS — I4510 Unspecified right bundle-branch block: Secondary | ICD-10-CM | POA: Diagnosis not present

## 2023-10-10 DIAGNOSIS — Z0001 Encounter for general adult medical examination with abnormal findings: Secondary | ICD-10-CM | POA: Diagnosis not present

## 2023-10-10 DIAGNOSIS — K219 Gastro-esophageal reflux disease without esophagitis: Secondary | ICD-10-CM | POA: Diagnosis not present

## 2023-10-10 DIAGNOSIS — D696 Thrombocytopenia, unspecified: Secondary | ICD-10-CM | POA: Diagnosis not present

## 2024-01-04 DIAGNOSIS — Z23 Encounter for immunization: Secondary | ICD-10-CM | POA: Diagnosis not present

## 2024-01-25 ENCOUNTER — Encounter (INDEPENDENT_AMBULATORY_CARE_PROVIDER_SITE_OTHER): Payer: Self-pay | Admitting: Gastroenterology

## 2024-02-02 DIAGNOSIS — M1000 Idiopathic gout, unspecified site: Secondary | ICD-10-CM | POA: Diagnosis not present
